# Patient Record
Sex: Female | Born: 1980 | Race: White | Hispanic: No | Marital: Married | Smoking: Former smoker
Health system: Southern US, Community
[De-identification: ages and names within clinical notes are randomized; demographics above are authoritative.]

## PROBLEM LIST (undated history)

## (undated) DIAGNOSIS — I776 Arteritis, unspecified: Secondary | ICD-10-CM

## (undated) HISTORY — DX: Arteritis, unspecified: I77.6

---

## 2004-12-13 ENCOUNTER — Emergency Department: Payer: Self-pay | Admitting: Emergency Medicine

## 2005-02-07 ENCOUNTER — Emergency Department: Payer: Self-pay | Admitting: Emergency Medicine

## 2005-04-27 ENCOUNTER — Emergency Department: Payer: Self-pay | Admitting: Emergency Medicine

## 2005-05-03 ENCOUNTER — Emergency Department: Payer: Self-pay | Admitting: Emergency Medicine

## 2005-05-04 ENCOUNTER — Ambulatory Visit: Payer: Self-pay | Admitting: Emergency Medicine

## 2005-07-30 ENCOUNTER — Emergency Department: Payer: Self-pay | Admitting: General Practice

## 2006-03-03 ENCOUNTER — Ambulatory Visit: Payer: Self-pay | Admitting: Unknown Physician Specialty

## 2006-03-04 ENCOUNTER — Inpatient Hospital Stay: Payer: Self-pay | Admitting: Obstetrics & Gynecology

## 2006-03-27 ENCOUNTER — Observation Stay: Payer: Self-pay

## 2006-04-04 ENCOUNTER — Observation Stay: Payer: Self-pay

## 2006-04-15 ENCOUNTER — Inpatient Hospital Stay: Payer: Self-pay | Admitting: Unknown Physician Specialty

## 2006-05-07 ENCOUNTER — Observation Stay: Payer: Self-pay | Admitting: Obstetrics and Gynecology

## 2006-05-07 ENCOUNTER — Inpatient Hospital Stay: Payer: Self-pay | Admitting: Obstetrics and Gynecology

## 2006-09-30 IMAGING — US US PELV - US TRANSVAGINAL
1 series · 17 of 25 positions shown · non-contrast
Comparison: none

REASON FOR EXAM: Pelvic Pain.
COMMENTS:

[Series 1: us pelv - us transvaginal · 17 of 84 slices shown]
[im 1/84]
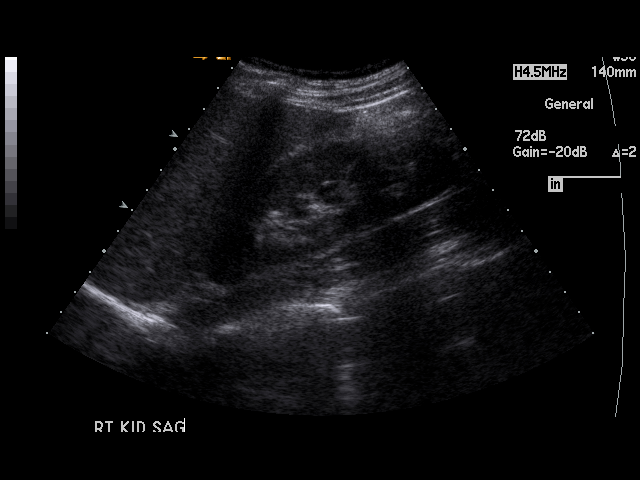
[im 7/84]
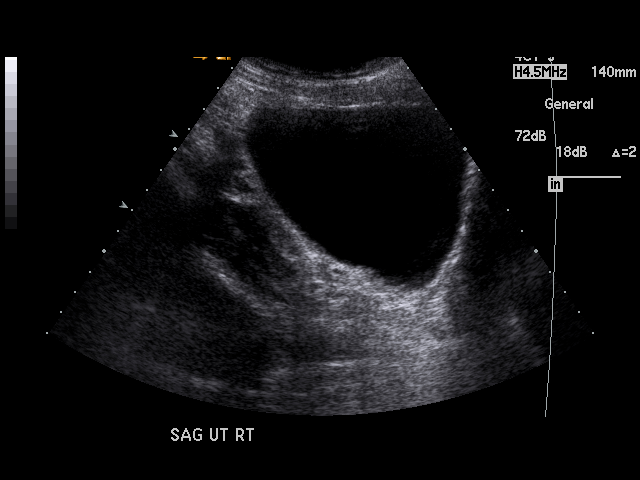
[im 11/84]
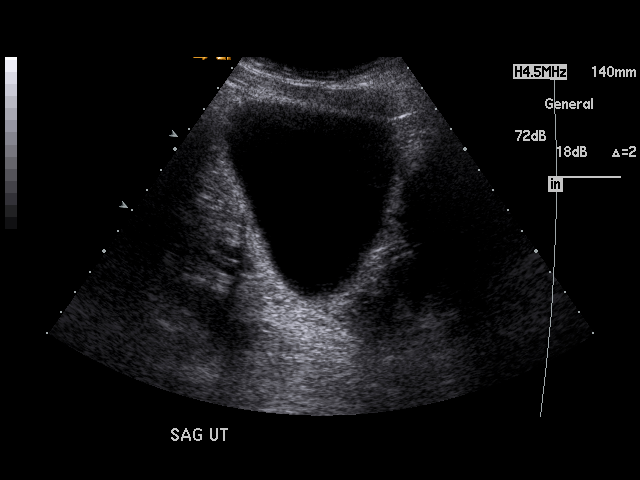
[im 18/84]
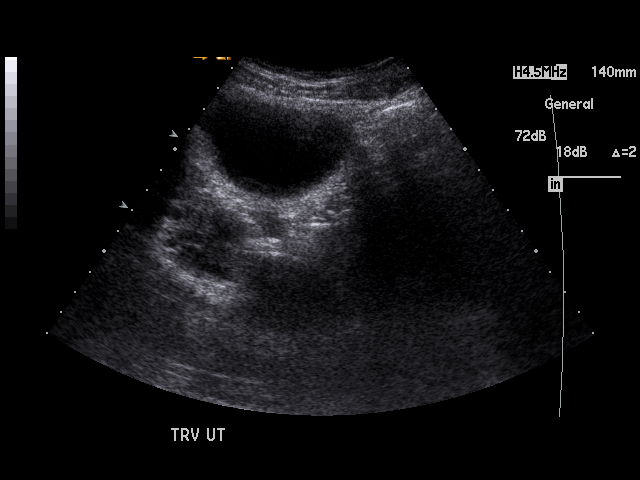
[im 21/84]
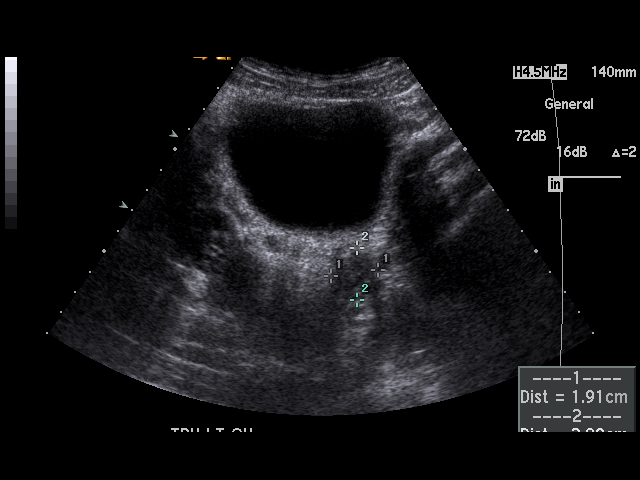
[im 28/84]
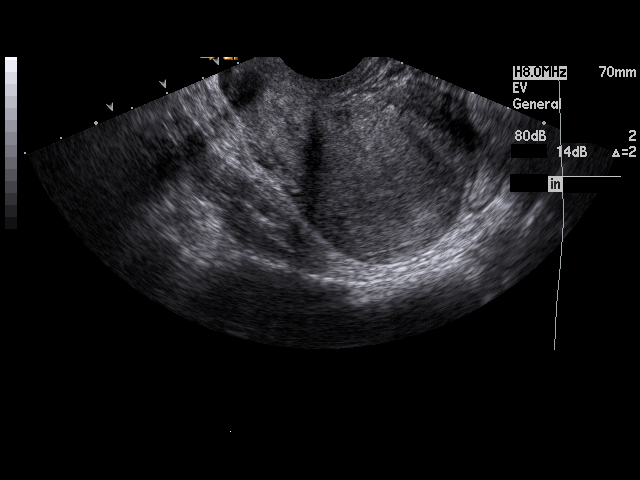
[im 32/84]
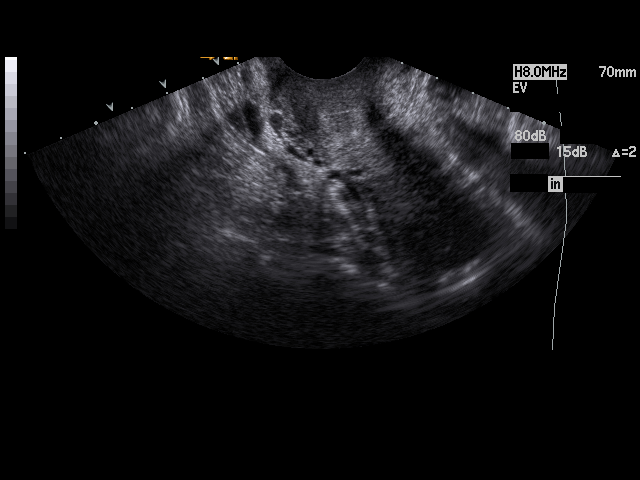
[im 39/84]
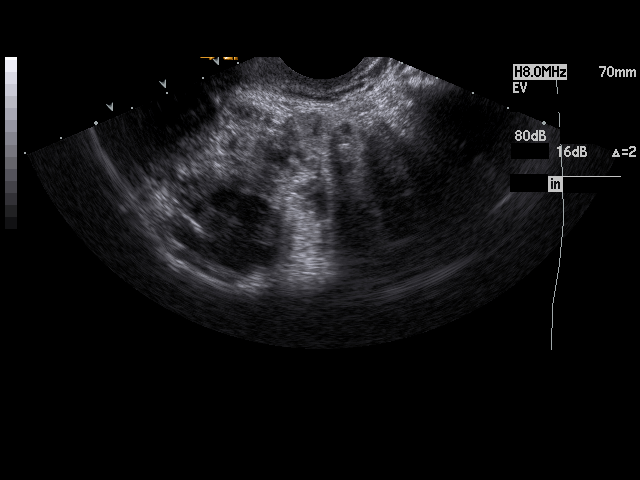
[im 42/84]
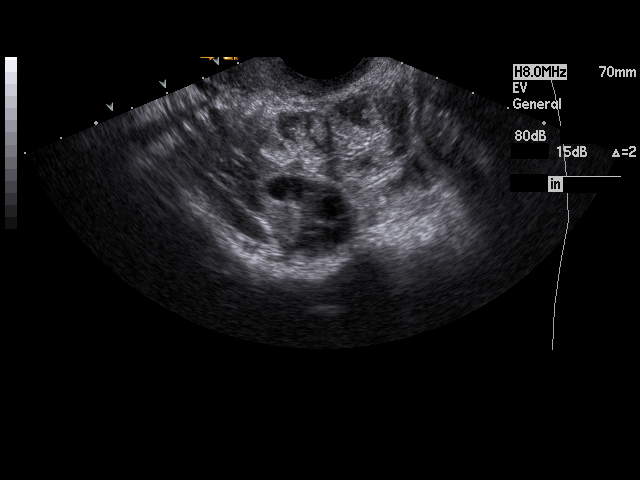
[im 45/84]
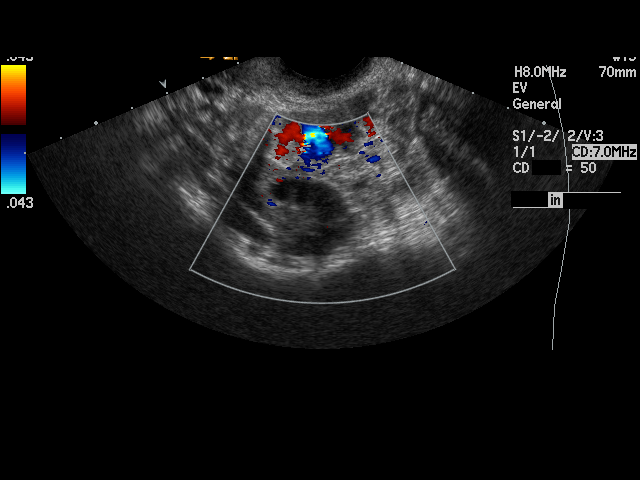
[im 52/84]
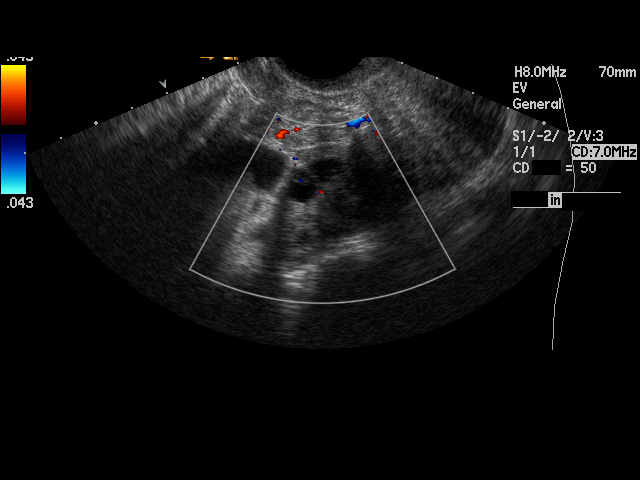
[im 56/84]
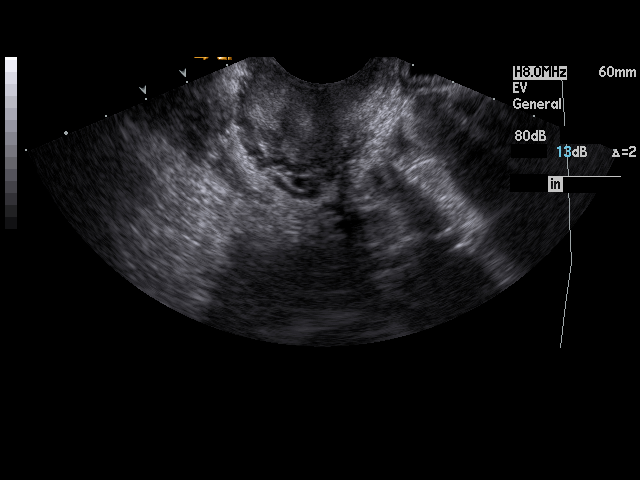
[im 63/84]
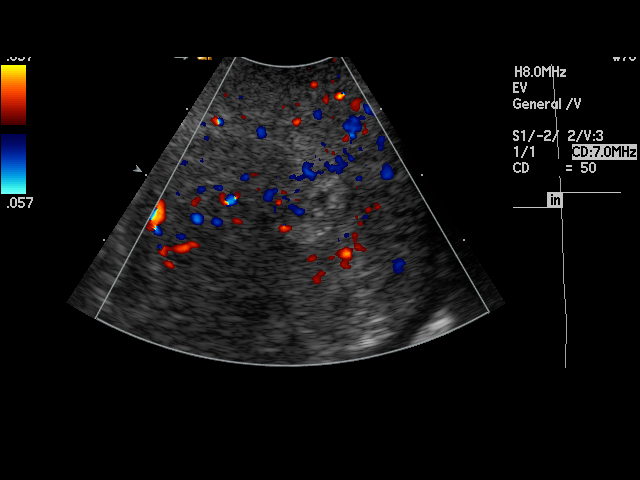
[im 66/84]
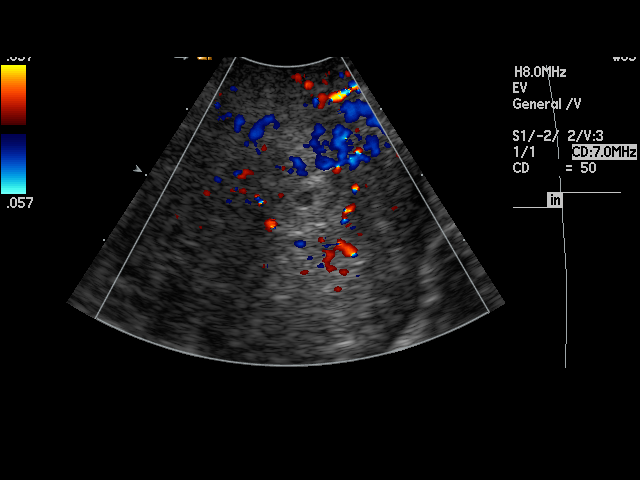
[im 73/84]
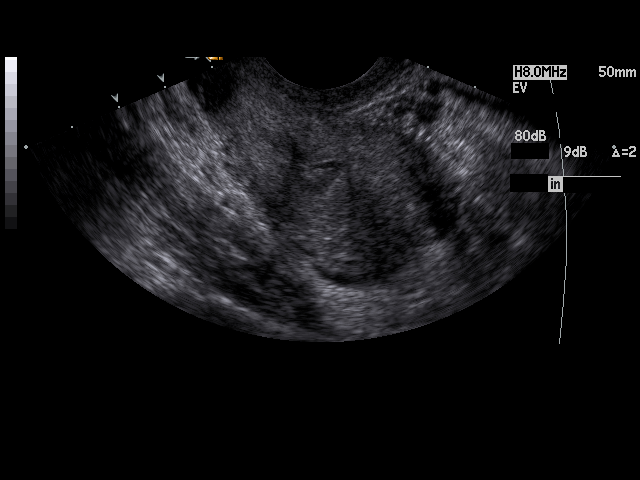
[im 77/84]
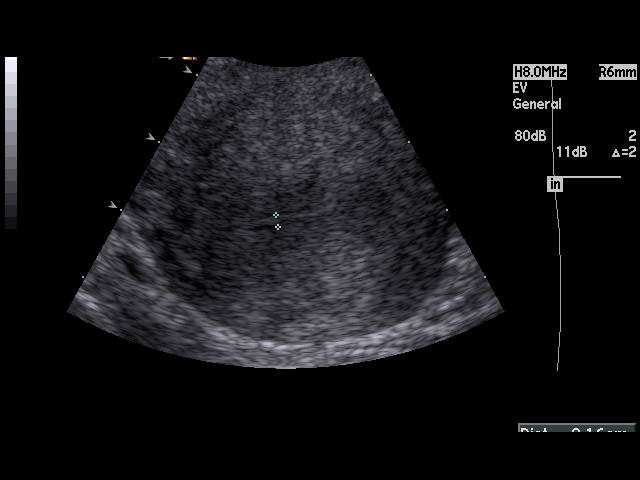
[im 84/84]
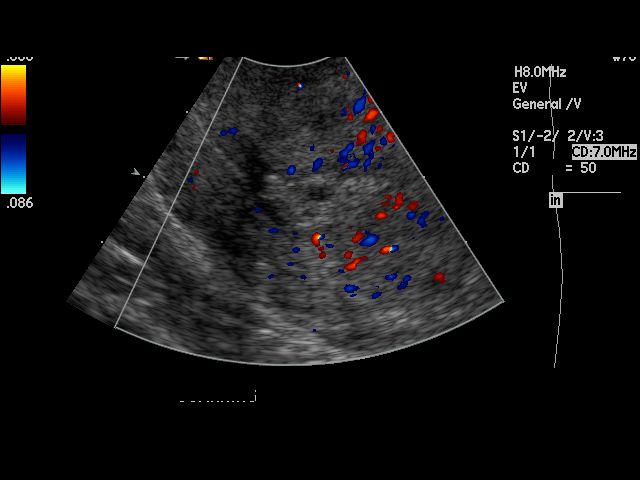

[17 of 25 positions shown; findings below may reference images not displayed]

PROCEDURE:     US  - US PELVIS MASS EXAM  - [DATE]  [DATE] [DATE]  [DATE]

RESULT:       The uterus measures 7.88 cm x 4.10 cm x 5.16 cm.  No uterine
mass lesion is seen.  The endometrium measures 6.8 mm at maximum thickness.
Within the endometrial canal just above the cervix there is an echo density
measuring approximately 1.12 cm diameter and having an area of central
lucency.  This may represent blood clot.  A follow-up exam would be helpful
to determine if this appearance persist.  Conceivably a polyp with
surrounding blood could produce a similar appearance.  The ovaries are
visualized bilaterally and are normal in appearance.  The RIGHT ovary
measures 3.35 cm at maximum diameter and the LEFT ovary measures 2.83 cm at
maximum diameter.  Follicular cysts are seen bilaterally.  No abnormal
adnexal masses are seen.  There is no free fluid in the pelvis.  The
visualized portion of the urinary bladder is normal in appearance.
IMPRESSION: There is echogenic material in the endometrial canal distally near the
cervix.  The etiology for this is to me uncertain.  Blood clot would be the
first consideration.  Incomplete abortion could produce a similar appearance
sonographically but is thought to be unlikely since the patient reportedly
has no history of recent pregnancy.  An endometrial polyp with associated
bleeding is also a consideration.  As noted above, a follow up exam would be
useful to determine if this appearance persist.

## 2007-06-30 ENCOUNTER — Emergency Department: Payer: Self-pay | Admitting: Internal Medicine

## 2007-09-12 IMAGING — US US RENAL KIDNEY
1 series · 17 of 25 positions shown · non-contrast
Comparison: none

REASON FOR EXAM: R flank pain and N/V and hematuria
COMMENTS:  LMP: Pregnant 36 weeks

[Series 1: us renal kidney · 17 of 28 slices shown]
[im 1/28]
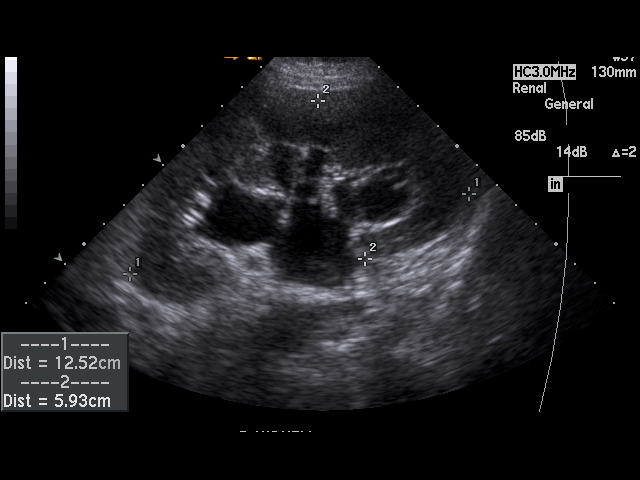
[im 3/28]
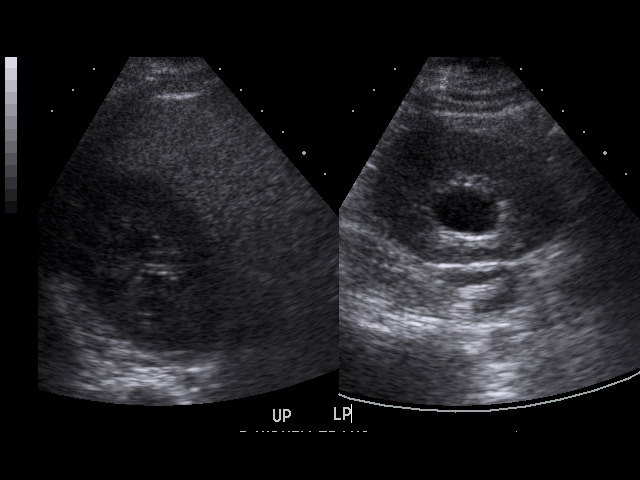
[im 4/28]
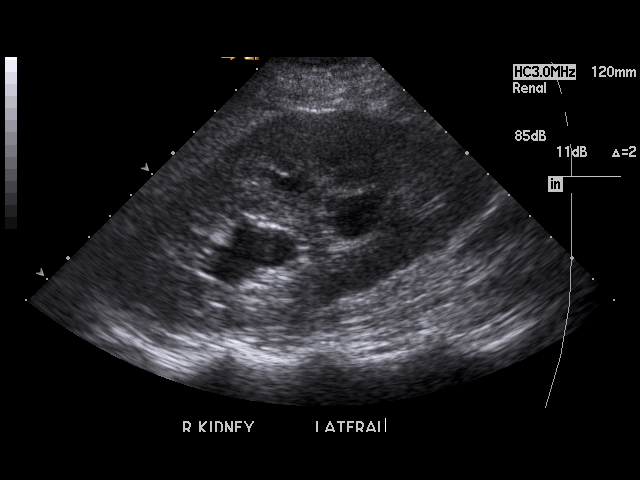
[im 6/28]
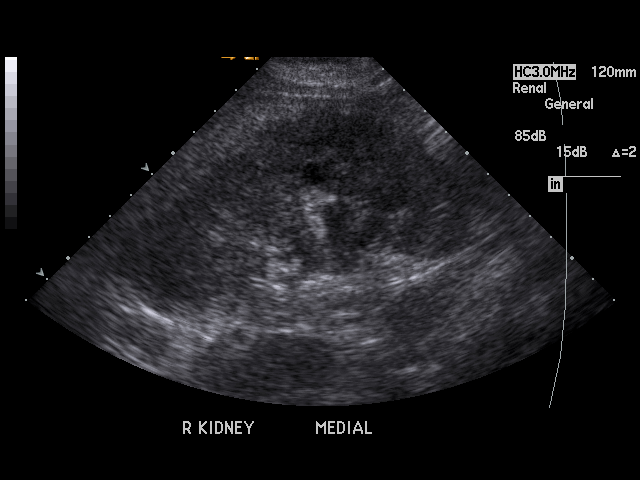
[im 7/28]
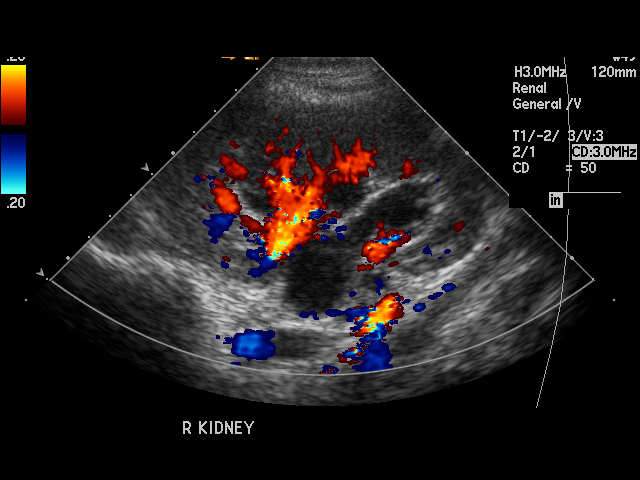
[im 10/28]
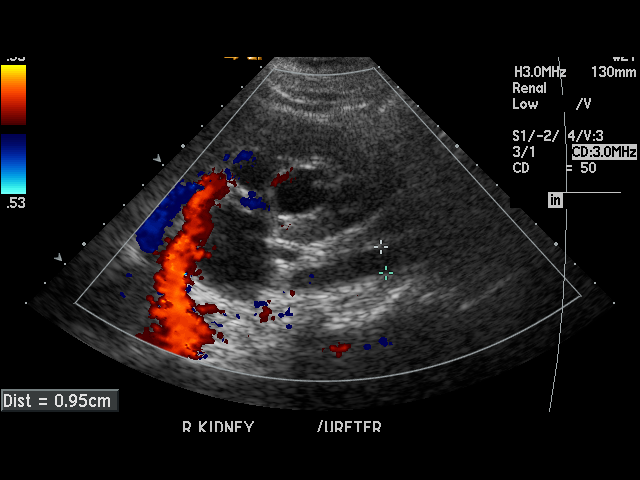
[im 11/28]
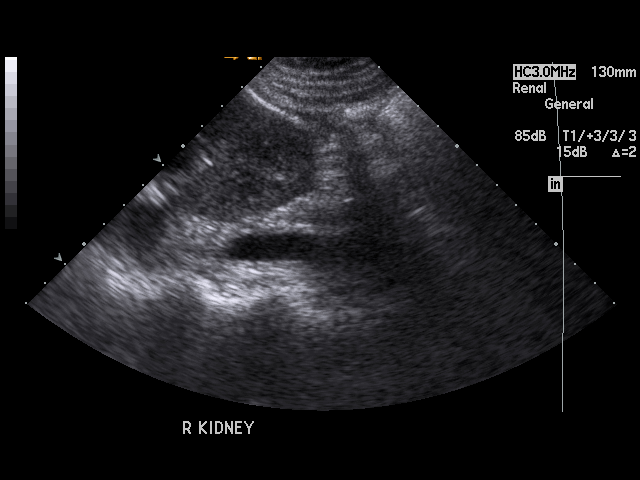
[im 13/28]
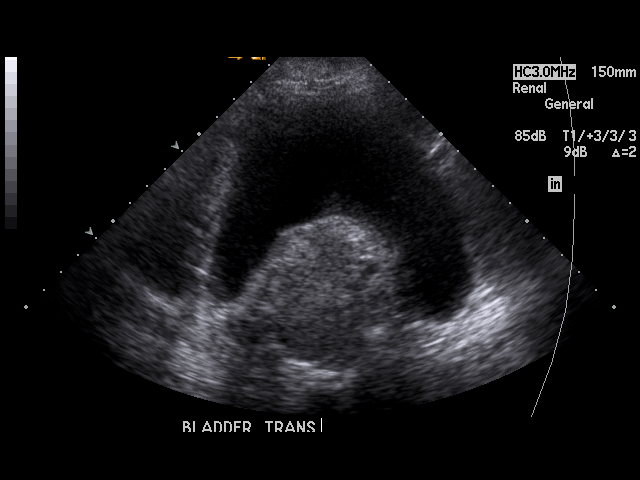
[im 14/28]
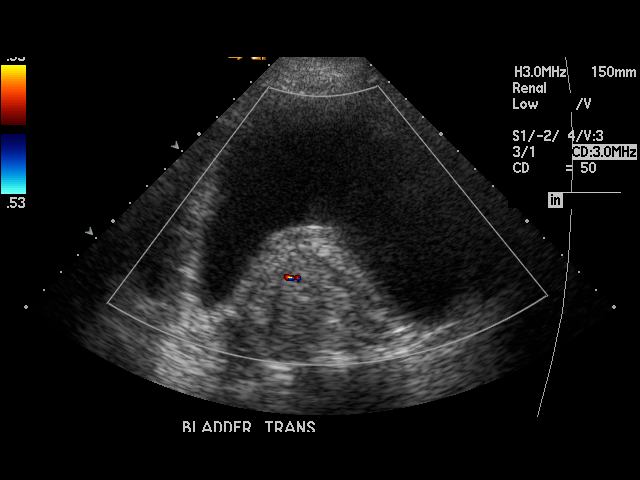
[im 15/28]
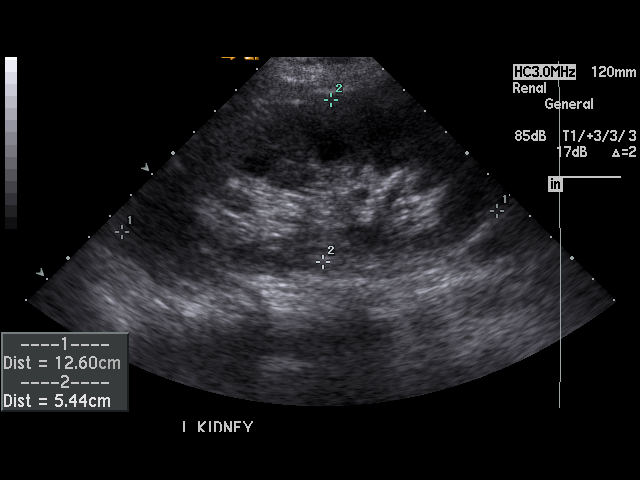
[im 17/28]
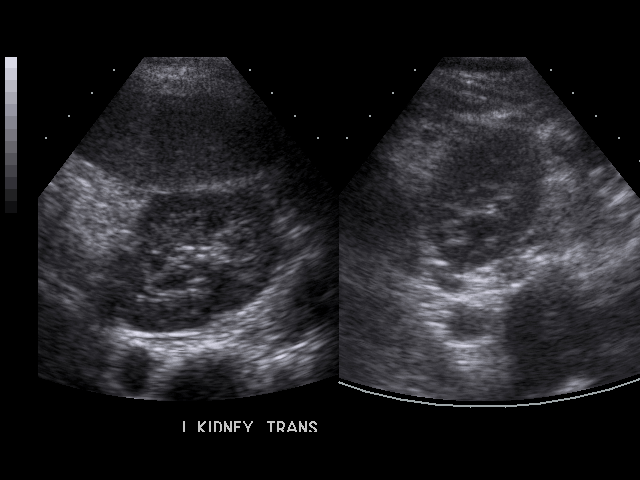
[im 19/28]
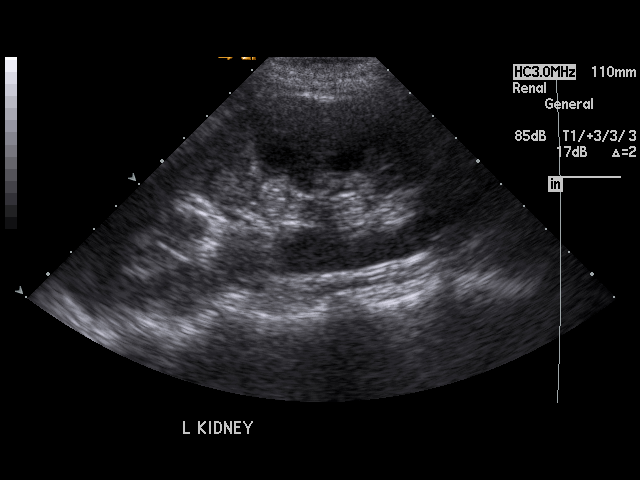
[im 21/28]
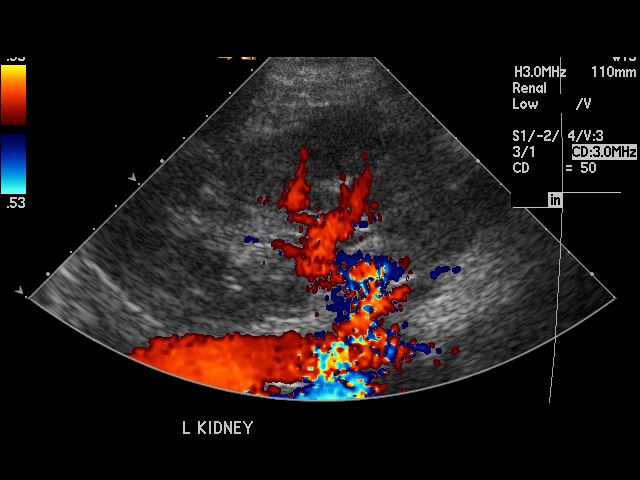
[im 22/28]
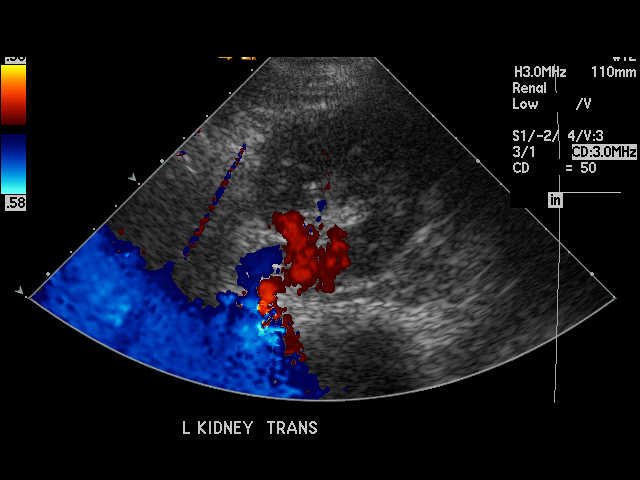
[im 24/28]
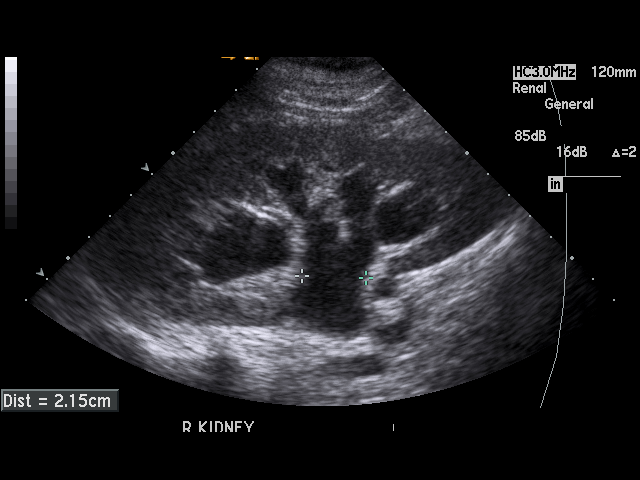
[im 25/28]
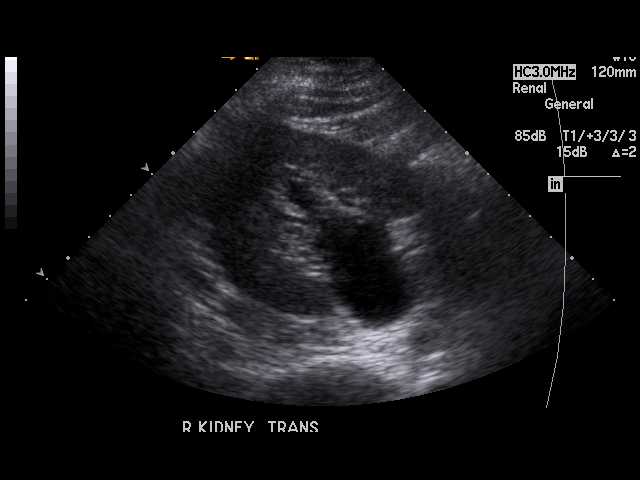
[im 28/28]
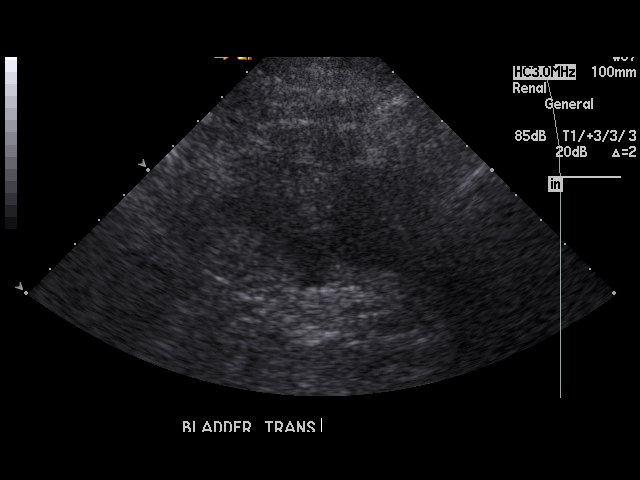

[17 of 25 positions shown; findings below may reference images not displayed]

PROCEDURE:     US  - US KIDNEY BILATERAL  - April 16, 2006  [DATE]

RESULT:     Size, shape, and echotexture of both kidneys are normal.  The
RIGHT kidney measures 12.5 cm and the LEFT 12.6.  Prominent hydronephrosis
of the RIGHT kidney is noted.  The patient is 36 weeks pregnant by history.
Mild LEFT hydronephrosis is noted.
IMPRESSION: Prominent hydronephrosis on the RIGHT, mild hydronephrosis on the LEFT.  The
patient is pregnant by history.

No focal renal parenchymal abnormalities are identified.

Bilateral renal flow is noted.

It should be noted that following voiding the hydronephrosis does not
resolve.

## 2014-08-07 ENCOUNTER — Emergency Department: Payer: Self-pay | Admitting: Emergency Medicine

## 2014-08-08 LAB — CBC WITH DIFFERENTIAL/PLATELET
BASOS ABS: 0 10*3/uL (ref 0.0–0.1)
Basophil %: 0.2 %
EOS ABS: 0.3 10*3/uL (ref 0.0–0.7)
Eosinophil %: 2.7 %
HCT: 39 % (ref 35.0–47.0)
HGB: 12.7 g/dL (ref 12.0–16.0)
LYMPHS PCT: 29.7 %
Lymphocyte #: 3.2 10*3/uL (ref 1.0–3.6)
MCH: 29.2 pg (ref 26.0–34.0)
MCHC: 32.5 g/dL (ref 32.0–36.0)
MCV: 90 fL (ref 80–100)
MONOS PCT: 6.6 %
Monocyte #: 0.7 x10 3/mm (ref 0.2–0.9)
NEUTROS PCT: 60.8 %
Neutrophil #: 6.5 10*3/uL (ref 1.4–6.5)
Platelet: 374 10*3/uL (ref 150–440)
RBC: 4.34 10*6/uL (ref 3.80–5.20)
RDW: 13.3 % (ref 11.5–14.5)
WBC: 10.7 10*3/uL (ref 3.6–11.0)

## 2014-08-08 LAB — URINALYSIS, COMPLETE
BILIRUBIN, UR: NEGATIVE
Blood: NEGATIVE
GLUCOSE, UR: NEGATIVE mg/dL (ref 0–75)
KETONE: NEGATIVE
LEUKOCYTE ESTERASE: NEGATIVE
Nitrite: NEGATIVE
PH: 6 (ref 4.5–8.0)
Protein: NEGATIVE
RBC,UR: NONE SEEN /HPF (ref 0–5)
Specific Gravity: 1.005 (ref 1.003–1.030)

## 2014-08-08 LAB — COMPREHENSIVE METABOLIC PANEL
ALK PHOS: 75 U/L
ALT: 59 U/L
Albumin: 4.1 g/dL (ref 3.4–5.0)
Anion Gap: 9 (ref 7–16)
BILIRUBIN TOTAL: 0.2 mg/dL (ref 0.2–1.0)
BUN: 13 mg/dL (ref 7–18)
Calcium, Total: 8.7 mg/dL (ref 8.5–10.1)
Chloride: 104 mmol/L (ref 98–107)
Co2: 26 mmol/L (ref 21–32)
Creatinine: 0.7 mg/dL (ref 0.60–1.30)
EGFR (African American): >60
EGFR (Non-African Amer.): >60
Glucose: 121 mg/dL — ABNORMAL HIGH (ref 65–99)
Osmolality: 279 (ref 275–301)
Potassium: 3.8 mmol/L (ref 3.5–5.1)
SGOT(AST): 35 U/L (ref 15–37)
SODIUM: 139 mmol/L (ref 136–145)
TOTAL PROTEIN: 8 g/dL (ref 6.4–8.2)

## 2014-08-08 LAB — LIPASE, BLOOD: Lipase: 233 U/L (ref 73–393)

## 2014-11-13 ENCOUNTER — Ambulatory Visit: Payer: Self-pay | Admitting: General Surgery

## 2014-12-26 ENCOUNTER — Encounter: Payer: Self-pay | Admitting: *Deleted

## 2015-07-10 ENCOUNTER — Ambulatory Visit: Payer: Self-pay | Admitting: Physician Assistant

## 2016-01-04 IMAGING — CT CT ABD-PELV W/ CM
2 of 4 series · 16 of 46 positions shown, 18 images · IV contrast (omnipaque)
Comparison: None.

CLINICAL DATA: Right lower quadrant fullness, pressure. Increased
over 3 months. History of stage I cervical cancer.

EXAM:
CT ABDOMEN AND PELVIS WITH CONTRAST
TECHNIQUE: Multidetector CT imaging of the abdomen and pelvis was performed
using the standard protocol following bolus administration of
intravenous contrast.
CONTRAST:  100 mL Omnipaque 350 IV.

[Series 2: routine abd pel with · axial · 0.72mm/px · z∈[-433,-18]mm · 13 of 91 slices shown, 15 images]
[im 4/91  soft-tissue]
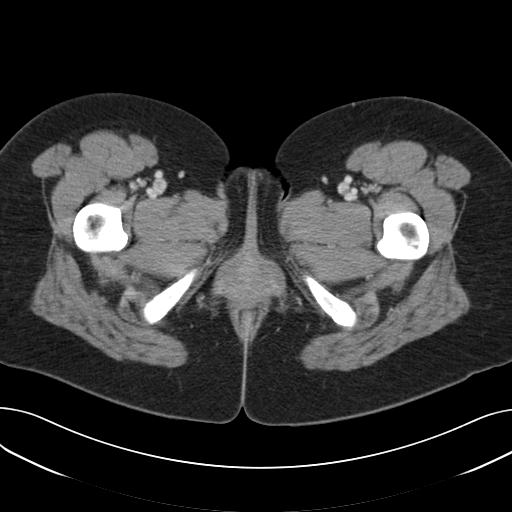
[im 4/91  bone]
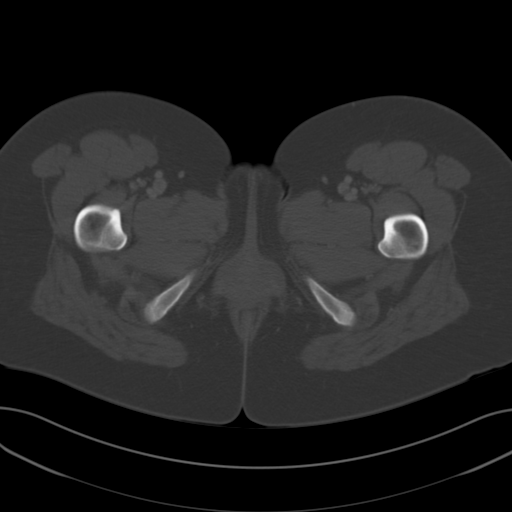
[im 12/91  soft-tissue]
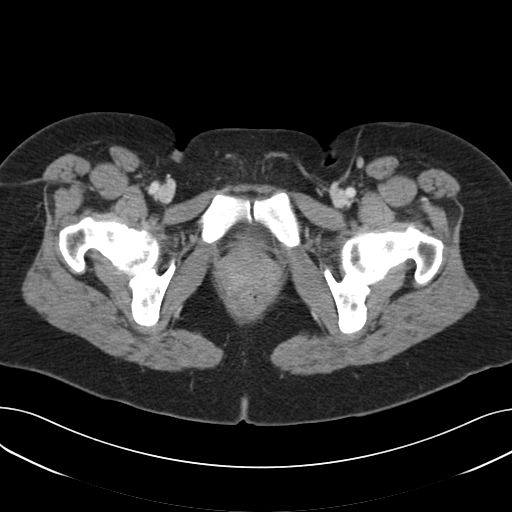
[im 19/91  soft-tissue]
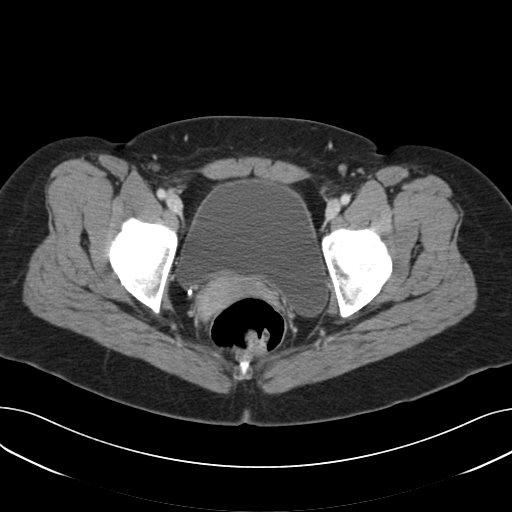
[im 27/91  soft-tissue]
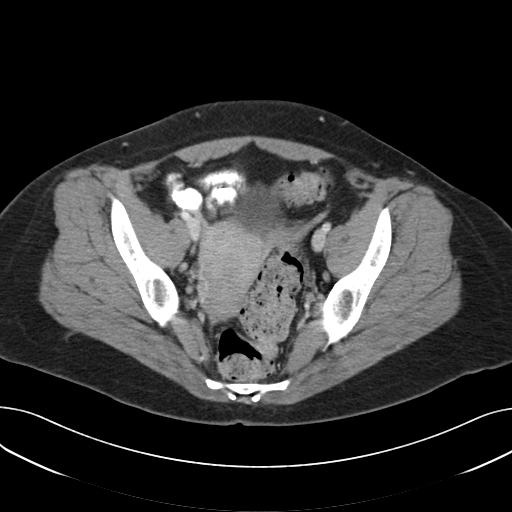
[im 31/91  soft-tissue]
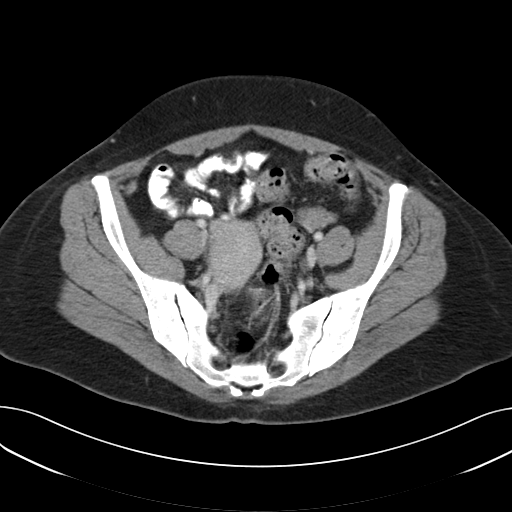
[im 38/91  soft-tissue]
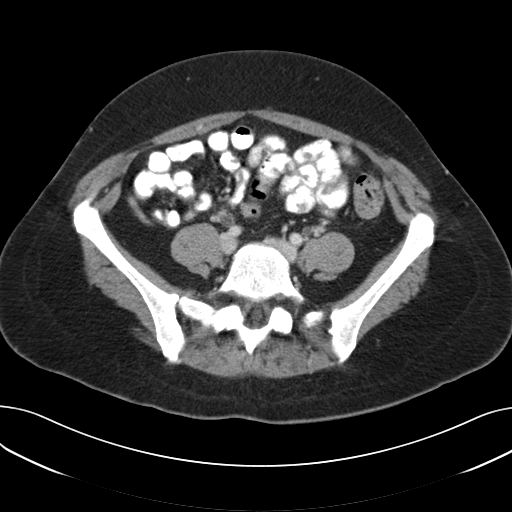
[im 46/91  soft-tissue]
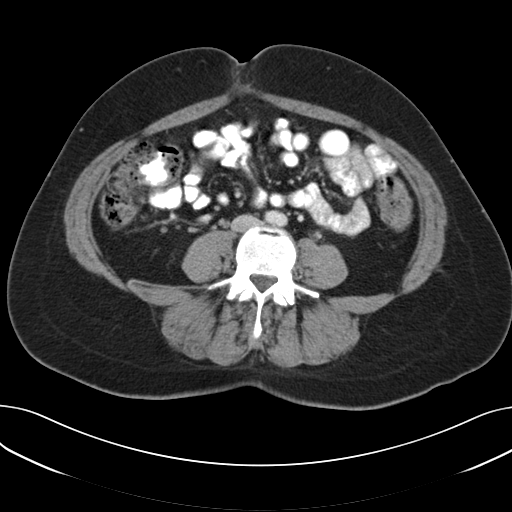
[im 53/91  soft-tissue]
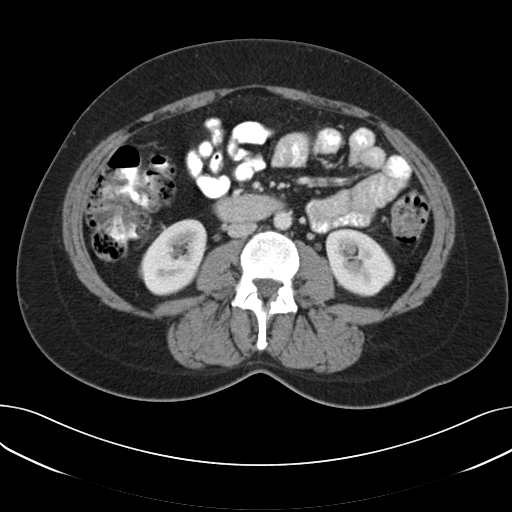
[im 61/91  soft-tissue]
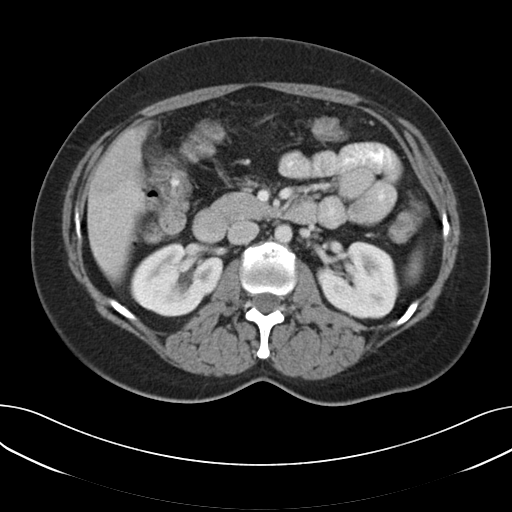
[im 61/91  bone]
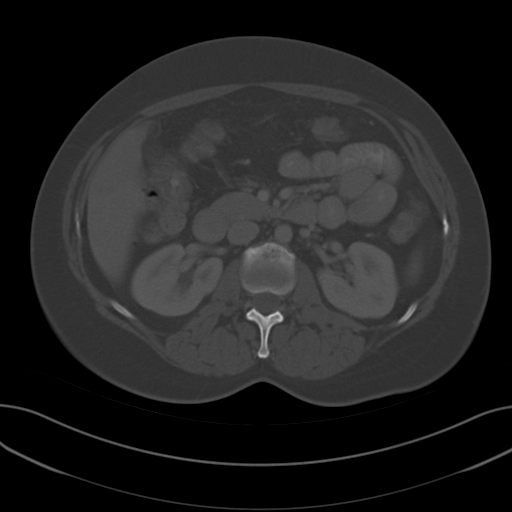
[im 64/91  soft-tissue]
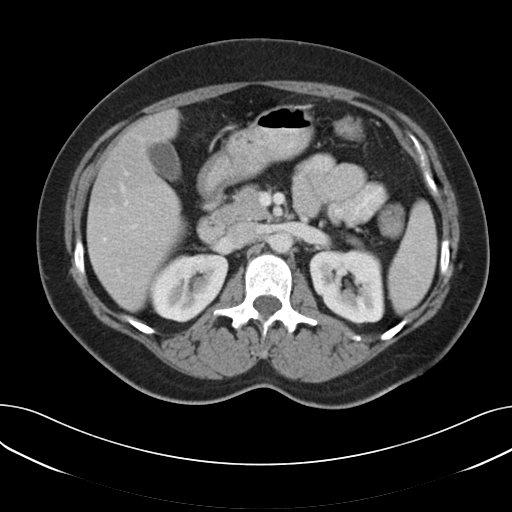
[im 72/91  soft-tissue]
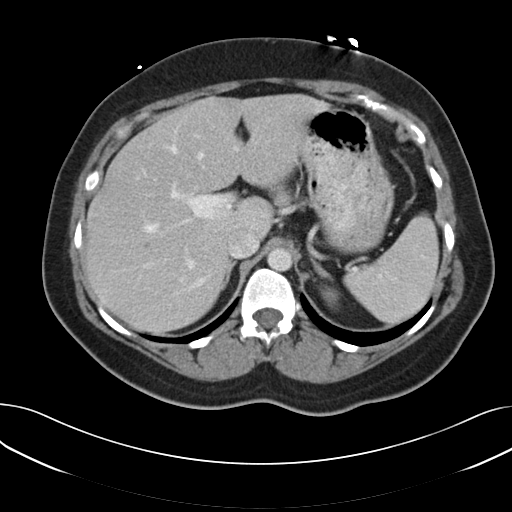
[im 79/91  soft-tissue]
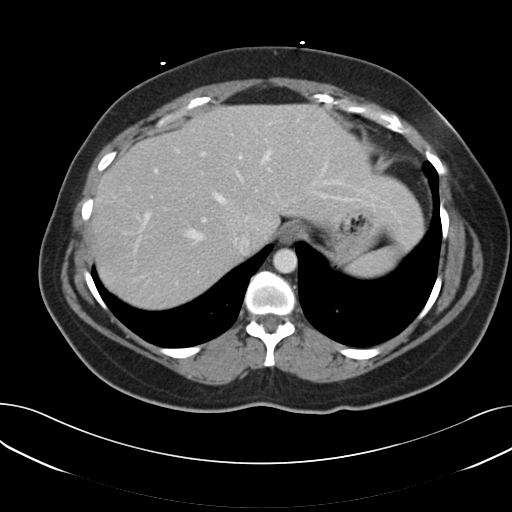
[im 87/91  soft-tissue]
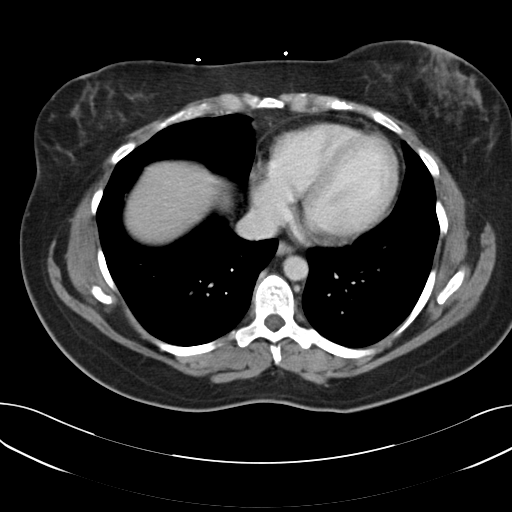

[Series 5: cor routine abd pel with · coronal · 0.91mm/px · 3 of 134 slices shown]
[im 45/134  soft-tissue]
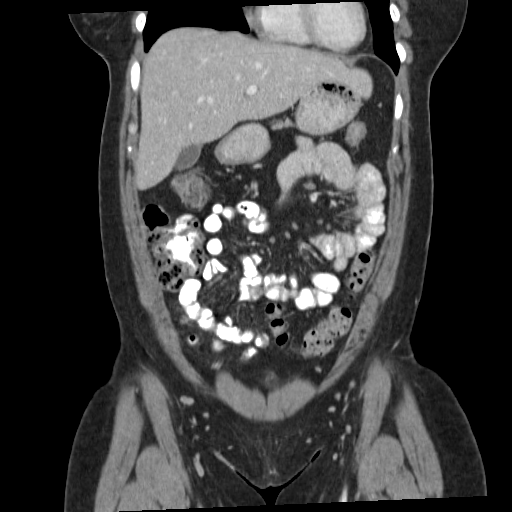
[im 60/134  soft-tissue]
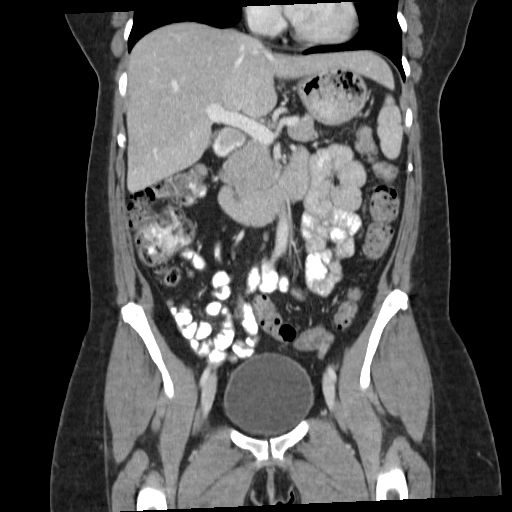
[im 74/134  soft-tissue]
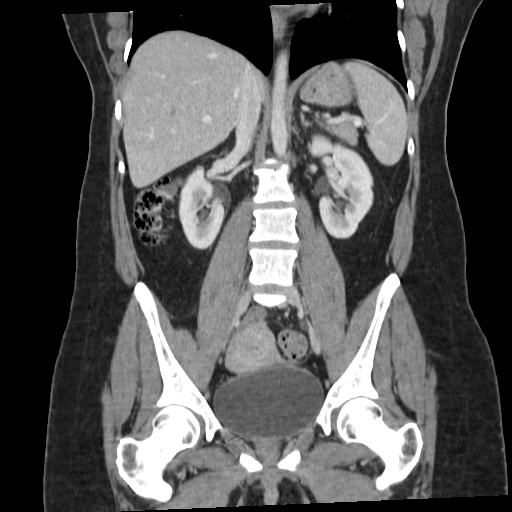

[16 of 46 positions shown; findings below may reference images not displayed]

FINDINGS: The included lung bases are clear.

The liver, gallbladder, spleen, pancreas, and adrenal glands are
normal. There is symmetric renal enhancement. There is mild
prominence of the right and left renal pelvis, likely extrarenal
pelvis. No obstructing stone. The bladder is physiologically
distended without bladder wall thickening.

There is probable mild colonic wall thickening from the hepatic
flexure to the descending colon, may reflect mild colitis. There are
no dilated or thickened small bowel loops. The appendix is normal.

The abdominal aorta is normal in caliber. There is no
retroperitoneal adenopathy. No free air, free fluid, or
intra-abdominal fluid collection.

The uterus appears prominent with minimal fluid in the endometrial
canal. Soft tissue fullness in the region of the cervix. Both
ovaries tentatively identified and normal. There is no pelvic free
fluid. Small lymph nodes in both external iliac chains. There is no
inguinal adenopathy.

There is a 9 mm sclerotic focus in the right superior pubic ramus,
nonspecific. Tiny sclerotic focus right acetabulum. No acute osseous
abnormality.
IMPRESSION: 1. Probable mild colonic wall thickening from the hepatic flexure to
the descending colon, may reflect mild colitis versus nondistention.
2. Soft tissue prominence in the region of the cervix.
3. Small sclerotic foci in the right superior pubic ramus and
acetabulum. These are likely bone islands, in the setting of
malignancy, metastatic disease is not entirely excluded.

## 2019-01-13 ENCOUNTER — Encounter: Payer: Self-pay | Admitting: Advanced Practice Midwife

## 2019-01-13 ENCOUNTER — Ambulatory Visit: Payer: No Typology Code available for payment source | Admitting: Advanced Practice Midwife

## 2019-05-29 ENCOUNTER — Other Ambulatory Visit: Payer: Self-pay

## 2019-05-29 DIAGNOSIS — Z20822 Contact with and (suspected) exposure to covid-19: Secondary | ICD-10-CM

## 2019-05-30 LAB — NOVEL CORONAVIRUS, NAA: SARS-CoV-2, NAA: NOT DETECTED

## 2019-06-30 ENCOUNTER — Other Ambulatory Visit: Payer: Self-pay

## 2019-06-30 DIAGNOSIS — Z20822 Contact with and (suspected) exposure to covid-19: Secondary | ICD-10-CM

## 2019-07-03 LAB — NOVEL CORONAVIRUS, NAA: SARS-CoV-2, NAA: NOT DETECTED

## 2019-07-06 ENCOUNTER — Other Ambulatory Visit: Payer: Self-pay

## 2019-07-06 DIAGNOSIS — Z20822 Contact with and (suspected) exposure to covid-19: Secondary | ICD-10-CM

## 2019-07-08 LAB — NOVEL CORONAVIRUS, NAA: SARS-CoV-2, NAA: NOT DETECTED

## 2019-08-15 ENCOUNTER — Ambulatory Visit: Payer: No Typology Code available for payment source | Attending: Internal Medicine

## 2019-08-15 DIAGNOSIS — Z20822 Contact with and (suspected) exposure to covid-19: Secondary | ICD-10-CM | POA: Insufficient documentation

## 2019-08-16 LAB — NOVEL CORONAVIRUS, NAA: SARS-CoV-2, NAA: NOT DETECTED

## 2020-08-26 ENCOUNTER — Ambulatory Visit
Admission: EM | Admit: 2020-08-26 | Discharge: 2020-08-26 | Disposition: A | Discharge status: Home / Self Care | Payer: No Typology Code available for payment source

## 2020-08-26 ENCOUNTER — Encounter: Payer: Self-pay | Admitting: Emergency Medicine

## 2020-08-26 ENCOUNTER — Ambulatory Visit: Payer: Self-pay

## 2020-08-26 ENCOUNTER — Other Ambulatory Visit: Payer: Self-pay

## 2020-08-26 DIAGNOSIS — R1031 Right lower quadrant pain: Secondary | ICD-10-CM | POA: Insufficient documentation

## 2020-08-26 DIAGNOSIS — R197 Diarrhea, unspecified: Secondary | ICD-10-CM | POA: Insufficient documentation

## 2020-08-26 DIAGNOSIS — G8929 Other chronic pain: Secondary | ICD-10-CM | POA: Insufficient documentation

## 2020-08-26 DIAGNOSIS — R112 Nausea with vomiting, unspecified: Secondary | ICD-10-CM | POA: Insufficient documentation

## 2020-08-26 DIAGNOSIS — K5289 Other specified noninfective gastroenteritis and colitis: Secondary | ICD-10-CM | POA: Insufficient documentation

## 2020-08-26 LAB — URINALYSIS, COMPLETE (UACMP) WITH MICROSCOPIC
Bilirubin Urine: NEGATIVE
Glucose, UA: NEGATIVE mg/dL
Hgb urine dipstick: NEGATIVE
Ketones, ur: NEGATIVE mg/dL
Leukocytes,Ua: NEGATIVE
Nitrite: NEGATIVE
Protein, ur: NEGATIVE mg/dL
RBC / HPF: NONE SEEN RBC/hpf (ref 0–5)
Specific Gravity, Urine: 1.025 (ref 1.005–1.030)
pH: 5.5 (ref 5.0–8.0)

## 2020-08-26 MED ORDER — ONDANSETRON 4 MG PO TBDP: 4.0000 mg | ORAL_TABLET | Freq: Three times a day (TID) | ORAL | 0 refills | Status: DC | PRN

## 2020-08-26 NOTE — Discharge Instructions (Addendum)
Only take Pepto as directed per box Follow BRAT diet  Avoid any dairy

## 2020-08-26 NOTE — ED Provider Notes (Signed)
[facility]    CSN: [id number] Arrival date & time: 08/26/20  1443      History   Chief Complaint Chief Complaint  Patient presents with  . Abdominal Pain  . Vomiting  . Diarrhea         HPI Andrea Mcgee is a 40 y.o. female who presents with being covid + as of 1/14 and has been having vomiting and having diarrhea x 3 days at least 12 per day with small amt of stool at a time and neg blood. No hx of antibiotic use.  Vomits 2-3 times per day  2- Chronic abdominal pain since 2016 at which time had an abdominal CT and was neg. Was told she may have a hernia and was supposed to Fu with a Careers adviser but she never went. In the past week the pain has been worse, she feels a fullness on the R lower and mid abdomen and felt pain from her R groin to R mid abd.She does not have PCP or insurance.  History reviewed. No pertinent past medical history.  There are no problems to display for this patient.   History reviewed. No pertinent surgical history.  OB History    Gravida  1   Para  1   Term  1   Preterm      AB      Living        SAB      IAB      Ectopic      Multiple      Live Births           Obstetric Comments            Home Medications    Prior to Admission medications   Medication Sig Start Date End Date Taking? Authorizing Provider  levocetirizine (XYZAL) 5 MG tablet Take 5 mg by mouth every evening.   Yes [provider]  ondansetron (ZOFRAN ODT) 4 MG disintegrating tablet Take 1 tablet (4 mg total) by mouth every 8 (eight) hours as needed for nausea or vomiting. 08/26/20  Yes Rodriguez-Southworth, Nettie Elm, PA-C    Family History Family History  Problem Relation Age of Onset  . Diabetes Maternal Grandmother   . Heart attack Maternal Grandmother   . Breast cancer Paternal Grandmother 22  . Lung cancer Paternal Uncle     Social History Social History   Tobacco Use  . Smoking status: Never Smoker  . Smokeless  tobacco: Never Used  Vaping Use  . Vaping Use: Never used  Substance Use Topics  . Alcohol use: Yes  . Drug use: Never     Allergies   Patient has no known allergies.   Review of Systems Review of Systems  Constitutional: Positive for appetite change, chills and fatigue. Negative for diaphoresis and fever.  HENT: Negative for congestion, ear discharge, ear pain, postnasal drip and rhinorrhea.   Eyes: Negative for discharge.  Respiratory: Negative for cough.   Gastrointestinal: Positive for abdominal pain, blood in stool, diarrhea and nausea. Negative for abdominal distention.  Genitourinary: Negative for difficulty urinating, dysuria, frequency, hematuria, pelvic pain, vaginal bleeding and vaginal discharge.  Musculoskeletal: Positive for back pain. Negative for gait problem and myalgias.       Aches between shoulderblades  Skin: Negative for rash and wound.  Neurological: Positive for weakness and headaches. Negative for dizziness.  Hematological: Negative for adenopathy.   Physical Exam Triage Vital Signs ED Triage Vitals  Enc Vitals Group  BP 08/26/20 1506 129/89     Pulse Rate 08/26/20 1506 85     Resp 08/26/20 1506 18     Temp 08/26/20 1506 98.5 F (36.9 C)     Temp Source 08/26/20 1506 Oral     SpO2 08/26/20 1506 99 %     Weight 08/26/20 1503 185 lb (83.9 kg)     Height 08/26/20 1503 5\' 4"  (1.626 m)     Head Circumference --      Peak Flow --      Pain Score 08/26/20 1503 6     Pain Loc --      Pain Edu? --      Excl. in GC? --    No data found.  Updated Vital Signs BP 129/89 (BP Location: Left Arm)   Pulse 85   Temp 98.5 F (36.9 C) (Oral)   Resp 18   Ht 5\' 4"  (1.626 m)   Wt 185 lb (83.9 kg)   LMP 08/13/2020   SpO2 99%   BMI 31.76 kg/m   Visual Acuity Right Eye Distance:   Left Eye Distance:   Bilateral Distance:    Right Eye Near:   Left Eye Near:    Bilateral Near:     Physical Exam Vitals and nursing note reviewed.  Constitutional:       General: She is not in acute distress.    Appearance: She is obese. She is not toxic-appearing.  HENT:     Head: Normocephalic.  Eyes:     Extraocular Movements: Extraocular movements intact.  Cardiovascular:     Rate and Rhythm: Normal rate and regular rhythm.  Pulmonary:     Effort: Pulmonary effort is normal.     Breath sounds: Normal breath sounds.  Abdominal:     General: Abdomen is protuberant. Bowel sounds are normal.     Palpations: Abdomen is soft. There is no hepatomegaly or splenomegaly.     Tenderness: There is abdominal tenderness in the right lower quadrant. There is no guarding or rebound. Negative signs include McBurney's sign and psoas sign.     Hernia: No hernia is present.     Comments: I also examined her standing and the R groin is a little puffy when compared to the L, and R mid abdomen, right below umbilicus is firm and tender. But soft when laying on her back. I dont see any hernias  Skin:    General: Skin is warm and dry.     Findings: No erythema or rash.  Neurological:     General: No focal deficit present.     Mental Status: She is alert and oriented to person, place, and time.  Psychiatric:        Mood and Affect: Mood normal.        Behavior: Behavior normal.    UC Treatments / Results  Labs (all labs ordered are listed, but only abnormal results are displayed) Labs Reviewed  URINALYSIS, COMPLETE (UACMP) WITH MICROSCOPIC - Abnormal; Notable for the following components:      Result Value   APPearance HAZY (*)    Bacteria, UA MANY (*)    All other components within normal limits    EKG   Radiology No results found.  Procedures Procedures (including critical care time)  Medications Ordered in UC Medications - No data to display  Initial Impression / Assessment and Plan / UC Course  I have reviewed the triage vital signs and the nursing notes. She will call   to get an abdominal ultrasound and will go to [facility] to get CBC  with diff and CMP since they are free for employees. We will inform her when the tests are back, but she is to check with [place] if she does not hear from [place] after 24 hours of having them done.  May just have  viral GE and recommended to follow BRAT diet and pepto. Needs to get established with a PCP.  I sent Zofran for her nausea.   Final Clinical Impressions(s) / UC Diagnoses   Final diagnoses:  Other noninfectious gastroenteritis  Nausea vomiting and diarrhea  Abdominal pain, chronic, right lower quadrant  Diarrhea, unspecified type     Discharge Instructions     Only take Pepto as directed per box Follow BRAT diet  Avoid any dairy    ED Prescriptions    Medication Sig Dispense Auth. Provider   ondansetron (ZOFRAN ODT) 4 MG disintegrating tablet Take 1 tablet (4 mg total) by mouth every 8 (eight) hours as needed for nausea or vomiting. 20 tablet Rodriguez-Southworth, Nettie Elm, PA-C     PDMP not reviewed this encounter.   Garey Ham, [place] 08/26/20 1855

## 2020-08-26 NOTE — ED Triage Notes (Signed)
Patient c/o abdominal pain that started 2 years ago. She states a doctor told her that she probably had a hernia and was supposed to f/u with a surgeon but she never did. She states over the last week the pain has worsened. Patient reports vomiting and diarrhea that started 3 days ago.  She does report being COVID positive on 08/09/20.

## 2020-08-28 ENCOUNTER — Other Ambulatory Visit: Payer: Self-pay | Admitting: Internal Medicine

## 2020-08-28 ENCOUNTER — Other Ambulatory Visit: Payer: Self-pay

## 2020-08-28 ENCOUNTER — Ambulatory Visit
Admission: RE | Admit: 2020-08-28 | Discharge: 2020-08-28 | Disposition: A | Discharge status: Home / Self Care | Payer: Self-pay | Source: Ambulatory Visit | Attending: Internal Medicine | Admitting: Internal Medicine

## 2020-08-28 DIAGNOSIS — R1031 Right lower quadrant pain: Secondary | ICD-10-CM

## 2020-08-28 LAB — CBC WITH DIFFERENTIAL/PLATELET
Basophils Absolute: 0 10*3/uL (ref 0.0–0.2)
Basos: 1 %
EOS (ABSOLUTE): 0.1 10*3/uL (ref 0.0–0.4)
Eos: 2 %
Hematocrit: 40.5 % (ref 34.0–46.6)
Hemoglobin: 13 g/dL (ref 11.1–15.9)
Immature Grans (Abs): 0 10*3/uL (ref 0.0–0.1)
Immature Granulocytes: 0 %
Lymphocytes Absolute: 1.7 10*3/uL (ref 0.7–3.1)
Lymphs: 24 %
MCH: 27.8 pg (ref 26.6–33.0)
MCHC: 32.1 g/dL (ref 31.5–35.7)
MCV: 87 fL (ref 79–97)
Monocytes Absolute: 0.5 10*3/uL (ref 0.1–0.9)
Monocytes: 7 %
Neutrophils Absolute: 4.4 10*3/uL (ref 1.4–7.0)
Neutrophils: 66 %
Platelets: 346 10*3/uL (ref 150–450)
RBC: 4.67 x10E6/uL (ref 3.77–5.28)
RDW: 13.1 % (ref 11.7–15.4)
WBC: 6.8 10*3/uL (ref 3.4–10.8)

## 2020-08-28 LAB — COMPREHENSIVE METABOLIC PANEL
ALT: 167 IU/L — ABNORMAL HIGH (ref 0–32)
AST: 122 IU/L — ABNORMAL HIGH (ref 0–40)
Albumin/Globulin Ratio: 1.8 (ref 1.2–2.2)
Albumin: 4.4 g/dL (ref 3.8–4.8)
Alkaline Phosphatase: 80 IU/L (ref 44–121)
BUN/Creatinine Ratio: 12 (ref 9–23)
BUN: 8 mg/dL (ref 6–20)
Bilirubin Total: 0.3 mg/dL (ref 0.0–1.2)
CO2: 21 mmol/L (ref 20–29)
Calcium: 9.2 mg/dL (ref 8.7–10.2)
Chloride: 105 mmol/L (ref 96–106)
Creatinine, Ser: 0.67 mg/dL (ref 0.57–1.00)
GFR calc Af Amer: 128 mL/min/{1.73_m2} (ref >59)
GFR calc non Af Amer: 111 mL/min/{1.73_m2} (ref >59)
Globulin, Total: 2.4 g/dL (ref 1.5–4.5)
Glucose: 121 mg/dL — ABNORMAL HIGH (ref 65–99)
Potassium: 4 mmol/L (ref 3.5–5.2)
Sodium: 140 mmol/L (ref 134–144)
Total Protein: 6.8 g/dL (ref 6.0–8.5)

## 2020-08-29 ENCOUNTER — Ambulatory Visit: Payer: Self-pay

## 2020-11-06 ENCOUNTER — Encounter: Payer: Self-pay | Admitting: Internal Medicine

## 2020-11-06 ENCOUNTER — Ambulatory Visit (INDEPENDENT_AMBULATORY_CARE_PROVIDER_SITE_OTHER): Payer: Self-pay | Admitting: Internal Medicine

## 2020-11-06 ENCOUNTER — Other Ambulatory Visit: Payer: Self-pay

## 2020-11-06 VITALS — BP 118/70 | HR 80 | Ht 64.0 in | Wt 188.0 lb

## 2020-11-06 DIAGNOSIS — R739 Hyperglycemia, unspecified: Secondary | ICD-10-CM

## 2020-11-06 DIAGNOSIS — F321 Major depressive disorder, single episode, moderate: Secondary | ICD-10-CM | POA: Insufficient documentation

## 2020-11-06 DIAGNOSIS — L501 Idiopathic urticaria: Secondary | ICD-10-CM | POA: Insufficient documentation

## 2020-11-06 DIAGNOSIS — R7989 Other specified abnormal findings of blood chemistry: Secondary | ICD-10-CM

## 2020-11-06 DIAGNOSIS — R8271 Bacteriuria: Secondary | ICD-10-CM

## 2020-11-06 MED ORDER — NITROFURANTOIN MONOHYD MACRO 100 MG PO CAPS: 100.0000 mg | ORAL_CAPSULE | Freq: Two times a day (BID) | ORAL | 0 refills | Status: AC

## 2020-11-06 MED ORDER — ESCITALOPRAM OXALATE 10 MG PO TABS: 10.0000 mg | ORAL_TABLET | Freq: Every day | ORAL | 1 refills | Status: DC

## 2020-11-06 NOTE — Progress Notes (Signed)
Date:  11/06/2020   Name:  Andrea Mcgee   DOB:  02/15/1981   MRN:  [id number]   Chief Complaint: Establish Care (Last labs at [facility] for liver and kidney function were elevated and she wants that rechecked today as well. She has a lot of pain in the last 2 months on the right- upper abdomen. Sometimes pains shoots to back. ), Blood Sugar Problem (1 month ago- Was at work and friend at work said she felt "things were blacking out." Has been thirsty a lot more often than normal. Fatigued. Wants A1C checked to check for diabetes since this does run in her family. Blood Sugar was 177 with only a cup of coffee. ), and Palpitations (Patient said at times since she tested POS for covid at the end of January 2 months ago. Said at times she can feel her hear speed up and then a pain shoots across to her left arm. Not having this pain today.)  Depression        This is a chronic problem.  Associated symptoms include fatigue and insomnia.  Associated symptoms include no decreased interest, no appetite change, no headaches and no suicidal ideas.     The symptoms are aggravated by family issues (husband works in [place] and she rarely sees him; 34 y/o son at home).  Past treatments include nothing. Elevated Liver function tests - seen at UC last month.  [place] below did not include liver.  She was 3 weeks post covid with nausea and diarrhea.  She was well hydrated and was only taking Advil as needed, no tylenol. Stools are looser than normal.  Still has left side discomfort.  No blood in the urine but UA at UC had bacteria and WBC.  Elevated BS - random reading at home 170.  She has been thirsty and tired.  She has polyuria and polydipsia.  US PELVIS LIMITED (TRANSABDOMINAL ONLY)  Result Date: 08/28/2020 CLINICAL DATA:  Right lower quadrant and groin pain. Evaluate for hernia. EXAM: ULTRASOUND OF RIGHT GROIN SOFT TISSUES TECHNIQUE: Ultrasound examination of the groin soft tissues was performed in the area of clinical  concern. COMPARISON:  None. FINDINGS: Focused ultrasound of the right groin demonstrates no inguinal hernia, mass, or fluid collection. IMPRESSION: 1. Negative. No sonographic abnormality seen in the right groin. Electronically Signed   By: Obie Dredge M.D.   On: 08/28/2020 14:31   Lab Results  Component Value Date   CREATININE 0.67 08/27/2020   BUN 8 08/27/2020   NA 140 08/27/2020   K 4.0 08/27/2020   CL 105 08/27/2020   CO2 21 08/27/2020   No results found for: CHOL, HDL, LDLCALC, LDLDIRECT, TRIG, CHOLHDL No results found for: TSH No results found for: HGBA1C Lab Results  Component Value Date   WBC 6.8 08/27/2020   HGB 13.0 08/27/2020   HCT 40.5 08/27/2020   MCV 87 08/27/2020   PLT 346 08/27/2020   Lab Results  Component Value Date   ALT 167 (H) 08/27/2020   AST 122 (H) 08/27/2020   ALKPHOS 80 08/27/2020   BILITOT 0.3 08/27/2020     Review of Systems  Constitutional: Positive for fatigue. Negative for appetite change, chills, fever and unexpected weight change.  Respiratory: Negative for cough, chest tightness, shortness of breath and wheezing.   Cardiovascular: Positive for palpitations. Negative for chest pain and leg swelling.  Gastrointestinal: Positive for abdominal pain. Negative for diarrhea, nausea and vomiting.  Genitourinary: Positive for frequency and  urgency. Negative for dysuria and hematuria.  Neurological: Negative for dizziness, light-headedness and headaches.  Psychiatric/Behavioral: Positive for depression, dysphoric mood and sleep disturbance. Negative for suicidal ideas. The patient is nervous/anxious and has insomnia.     Patient Active Problem List   Diagnosis Date Noted  . Chronic idiopathic urticaria 11/06/2020  . Current moderate episode of major depressive disorder without prior episode (HCC) 11/06/2020    No Known Allergies  History reviewed. No pertinent surgical history.  Social History   Tobacco Use  . Smoking status: Former  Smoker    Packs/day: 1.00    Years: 7.00    Pack years: 7.00    Types: Cigarettes    Quit date: 2012    Years since quitting: 10.2  . Smokeless tobacco: Never Used  Vaping Use  . Vaping Use: Never used  Substance Use Topics  . Alcohol use: Yes    Alcohol/week: 3.0 standard drinks    Types: 3 Glasses of wine per week  . Drug use: Never    Medication list has been reviewed and updated.  Current Meds  Medication Sig  . Cetirizine-Pseudoephedrine (ZYRTEC-D PO) Take by mouth.  . escitalopram (LEXAPRO) 10 MG tablet Take 1 tablet (10 mg total) by mouth daily.  . famotidine (PEPCID) 20 MG tablet Take 20 mg by mouth 2 (two) times daily.  . hydrOXYzine (ATARAX/VISTARIL) 50 MG tablet Take 25-50 mg by mouth 3 (three) times daily as needed.  . Ibuprofen 200 MG CAPS Take 400 mg by mouth every 6 (six) hours as needed.  Marland Kitchen LEVOCETIRIZINE DIHYDROCHLORIDE PO Take 10 mg by mouth every evening.  . nitrofurantoin, macrocrystal-monohydrate, (MACROBID) 100 MG capsule Take 1 capsule (100 mg total) by mouth 2 (two) times daily for 7 days.  . Omalizumab (XOLAIR [place]) Inject 150 mg into the skin every 28 (twenty-eight) days. Dr. Cheree Ditto.    [facility] 2/9 Scores 11/06/2020 11/06/2020  PHQ - 2 Score 4 0  PHQ- 9 Score 12 0    GAD 7 : Generalized Anxiety Score 11/06/2020 11/06/2020  Nervous, Anxious, on Edge 2 0  Control/stop worrying 2 0  Worry too much - different things 2 0  Trouble relaxing 2 0  Restless 3 0  Easily annoyed or irritable 3 0  Afraid - awful might happen 3 0  Total GAD 7 Score 17 0  Anxiety Difficulty Not difficult at all Not difficult at all    BP Readings from Last 3 Encounters:  11/06/20 118/70  08/26/20 129/89    Physical Exam Vitals and nursing note reviewed.  Constitutional:      General: She is not in acute distress.    Appearance: Normal appearance. She is well-developed.  HENT:     Head: Normocephalic and atraumatic.  Neck:     Vascular: No carotid bruit.  Cardiovascular:      Rate and Rhythm: Normal rate and regular rhythm.     Pulses: Normal pulses.     Heart sounds: No murmur heard.   Pulmonary:     Effort: Pulmonary effort is normal. No respiratory distress.     Breath sounds: No wheezing or rhonchi.  Abdominal:     General: Abdomen is flat.     Palpations: Abdomen is soft.     Tenderness: There is abdominal tenderness in the right lower quadrant. There is right CVA tenderness. There is no left CVA tenderness, guarding or rebound.  Musculoskeletal:     Cervical back: Normal range of motion.  Lymphadenopathy:  Cervical: No cervical adenopathy.  Skin:    General: Skin is warm and dry.     Findings: No rash.  Neurological:     Mental Status: She is alert and oriented to person, place, and time.  Psychiatric:        Attention and Perception: Attention normal.        Mood and Affect: Mood is depressed. Affect is not tearful.        Behavior: Behavior normal.        Thought Content: Thought content does not include suicidal plan.     Wt Readings from Last 3 Encounters:  11/06/20 188 lb (85.3 kg)  08/26/20 185 lb (83.9 kg)    BP 118/70   Pulse 80   Ht 5\' 4"  (1.626 m)   Wt 188 lb (85.3 kg)   LMP 11/05/2020 (Exact Date)   SpO2 97%   BMI 32.27 kg/m   Assessment and Plan: 1. Elevated liver function tests Possible residual from earlier Covid infection Will repeat today - if still elevated, will need Abd 01/05/2021 complete - Comprehensive metabolic panel - Hepatitis, Acute  2. Elevated serum glucose Check A1C and advise - Hemoglobin A1c  3. Bacteriuria Possible UTI contributing to low abdominal discomfort - nitrofurantoin, macrocrystal-monohydrate, (MACROBID) 100 MG capsule; Take 1 capsule (100 mg total) by mouth 2 (two) times daily for 7 days.  Dispense: 14 capsule; Refill: 0  4. Current moderate episode of major depressive disorder without prior episode (HCC) Due to family situation No SI/HI.  Will begin Lexapro. Follow up in 6 weeks,  sooner if needed. - escitalopram (LEXAPRO) 10 MG tablet; Take 1 tablet (10 mg total) by mouth daily.  Dispense: 30 tablet; Refill: 1  5. Chronic idiopathic urticaria Followed by Dr. [place] On Xolair, zyrtec and Vistaril   Partially dictated using Dragon software. Any errors are unintentional.  Cheree Ditto, MD [facility]  11/06/2020

## 2020-11-07 ENCOUNTER — Encounter: Payer: Self-pay | Admitting: Internal Medicine

## 2020-11-07 LAB — COMPREHENSIVE METABOLIC PANEL
ALT: 90 IU/L — ABNORMAL HIGH (ref 0–32)
AST: 71 IU/L — ABNORMAL HIGH (ref 0–40)
Albumin/Globulin Ratio: 1.8 (ref 1.2–2.2)
Albumin: 4.6 g/dL (ref 3.8–4.8)
Alkaline Phosphatase: 65 IU/L (ref 44–121)
BUN/Creatinine Ratio: 10 (ref 9–23)
BUN: 7 mg/dL (ref 6–20)
Bilirubin Total: 0.2 mg/dL (ref 0.0–1.2)
CO2: 23 mmol/L (ref 20–29)
Calcium: 9.5 mg/dL (ref 8.7–10.2)
Chloride: 103 mmol/L (ref 96–106)
Creatinine, Ser: 0.71 mg/dL (ref 0.57–1.00)
Globulin, Total: 2.5 g/dL (ref 1.5–4.5)
Glucose: 85 mg/dL (ref 65–99)
Potassium: 4 mmol/L (ref 3.5–5.2)
Sodium: 143 mmol/L (ref 134–144)
Total Protein: 7.1 g/dL (ref 6.0–8.5)
eGFR: 111 mL/min/{1.73_m2} (ref >59)

## 2020-11-07 LAB — HEPATITIS PANEL, ACUTE
Hep A IgM: NEGATIVE
Hep B C IgM: NEGATIVE
Hep C Virus Ab: <0.1 s/co ratio (ref 0.0–0.9)
Hepatitis B Surface Ag: NEGATIVE

## 2020-11-07 LAB — HEMOGLOBIN A1C
Est. average glucose Bld gHb Est-mCnc: 120 mg/dL
Hgb A1c MFr Bld: 5.8 % — ABNORMAL HIGH (ref 4.8–5.6)

## 2020-12-06 ENCOUNTER — Other Ambulatory Visit: Payer: Self-pay | Admitting: Internal Medicine

## 2020-12-06 ENCOUNTER — Encounter: Payer: Self-pay | Admitting: *Deleted

## 2020-12-06 DIAGNOSIS — F321 Major depressive disorder, single episode, moderate: Secondary | ICD-10-CM

## 2020-12-06 NOTE — Telephone Encounter (Signed)
This encounter was created in error - please disregard.

## 2020-12-18 ENCOUNTER — Encounter: Payer: Self-pay | Admitting: Internal Medicine

## 2020-12-18 ENCOUNTER — Ambulatory Visit: Payer: Self-pay | Admitting: Internal Medicine

## 2020-12-19 ENCOUNTER — Telehealth: Payer: Self-pay | Admitting: Internal Medicine

## 2020-12-20 ENCOUNTER — Other Ambulatory Visit: Payer: Self-pay

## 2020-12-20 ENCOUNTER — Encounter: Payer: Self-pay | Admitting: Internal Medicine

## 2020-12-20 ENCOUNTER — Telehealth (INDEPENDENT_AMBULATORY_CARE_PROVIDER_SITE_OTHER): Payer: Self-pay | Admitting: Internal Medicine

## 2020-12-20 ENCOUNTER — Telehealth: Payer: Self-pay

## 2020-12-20 VITALS — Ht 64.0 in

## 2020-12-20 DIAGNOSIS — R7989 Other specified abnormal findings of blood chemistry: Secondary | ICD-10-CM

## 2020-12-20 DIAGNOSIS — F5104 Psychophysiologic insomnia: Secondary | ICD-10-CM

## 2020-12-20 DIAGNOSIS — F321 Major depressive disorder, single episode, moderate: Secondary | ICD-10-CM

## 2020-12-20 DIAGNOSIS — R7303 Prediabetes: Secondary | ICD-10-CM | POA: Insufficient documentation

## 2020-12-20 MED ORDER — ZALEPLON 10 MG PO CAPS
10.0000 mg | ORAL_CAPSULE | Freq: Every evening | ORAL | 1 refills | Status: DC | PRN
Start: 2020-12-20 — End: 2021-04-02

## 2020-12-20 MED ORDER — ESCITALOPRAM OXALATE 20 MG PO TABS
20.0000 mg | ORAL_TABLET | Freq: Every day | ORAL | 2 refills | Status: DC
Start: 2020-12-20 — End: 2021-04-02

## 2020-12-20 NOTE — Telephone Encounter (Signed)
This visit type is being conducted due to national recommendations for restrictions regarding the COVID- 19 Pandemic (e.g. social distancing) in effort to limit this patients exposure and mitigate transmission in our community. This visit type is felt to be most appropriate for this patient at this time. I connected with the patient today and received telephone consent from the patient and patient understand this consent will be good for 1 year.  KP  

## 2020-12-20 NOTE — Progress Notes (Signed)
Date:  12/20/2020   Name:  Andrea Mcgee   DOB:  13-Aug-1980   MRN:  [id number]  This encounter was conducted via video encounter due to the need for social distancing in light of the Covid-19 pandemic.  The patient was correctly identified.  I advised that I am conducting the visit from a secure room in my office at [facility] clinic.  The patient is located at work. The limitations of this form of encounter were discussed with the patient and he/she agreed to proceed.  Some vital signs will be absent.  Chief Complaint: Depression (Taking lexapro, pt stated its helping but not a lot, higher dosage??)  Depression        This is a new problem.  The current episode started more than 1 month ago.   The problem has been gradually improving since onset.  Associated symptoms include no fatigue, not irritable, no body aches, no headaches and no suicidal ideas.  Past treatments include SSRIs - Selective serotonin reuptake inhibitors (lexapro 10 mg started last visit).  Elevated liver tests - these were improved at last check.  For insurance reasons we did not jump directly to [place]. Patient feels well.   No abdominal pain or jaundice.  Lab Results  Component Value Date   CREATININE 0.71 11/06/2020   BUN 7 11/06/2020   NA 143 11/06/2020   K 4.0 11/06/2020   CL 103 11/06/2020   CO2 23 11/06/2020   No results found for: CHOL, HDL, LDLCALC, LDLDIRECT, TRIG, CHOLHDL No results found for: TSH Lab Results  Component Value Date   HGBA1C 5.8 (H) 11/06/2020   Lab Results  Component Value Date   WBC 6.8 08/27/2020   HGB 13.0 08/27/2020   HCT 40.5 08/27/2020   MCV 87 08/27/2020   PLT 346 08/27/2020   Lab Results  Component Value Date   ALT 90 (H) 11/06/2020   AST 71 (H) 11/06/2020   ALKPHOS 65 11/06/2020   BILITOT 0.2 11/06/2020     Review of Systems  Constitutional: Negative for chills, fatigue, fever and unexpected weight change.  Respiratory: Negative for chest tightness and  shortness of breath.   Cardiovascular: Negative for chest pain.  Gastrointestinal: Negative for abdominal pain, constipation and diarrhea.  Neurological: Negative for dizziness, light-headedness and headaches.  Psychiatric/Behavioral: Positive for depression, dysphoric mood and sleep disturbance. Negative for suicidal ideas. The patient is nervous/anxious.     Patient Active Problem List   Diagnosis Date Noted  . Prediabetes 12/20/2020  . Elevated liver function tests 12/20/2020  . Chronic idiopathic urticaria 11/06/2020  . Current moderate episode of major depressive disorder without prior episode (HCC) 11/06/2020    No Known Allergies  History reviewed. No pertinent surgical history.  Social History   Tobacco Use  . Smoking status: Former Smoker    Packs/day: 1.00    Years: 7.00    Pack years: 7.00    Types: Cigarettes    Quit date: 2012    Years since quitting: 10.4  . Smokeless tobacco: Never Used  Vaping Use  . Vaping Use: Never used  Substance Use Topics  . Alcohol use: Yes    Alcohol/week: 3.0 standard drinks    Types: 3 Glasses of wine per week  . Drug use: Never     Medication list has been reviewed and updated.  Current Meds  Medication Sig  . Cetirizine-Pseudoephedrine (ZYRTEC-D PO) Take by mouth.  . escitalopram (LEXAPRO) 10 MG tablet Take 1 tablet (  10 mg total) by mouth daily.  . famotidine (PEPCID) 20 MG tablet Take 20 mg by mouth 2 (two) times daily.  . hydrOXYzine (ATARAX/VISTARIL) 50 MG tablet Take 25-50 mg by mouth 3 (three) times daily as needed.  . Ibuprofen 200 MG CAPS Take 400 mg by mouth every 6 (six) hours as needed.  Marland Kitchen LEVOCETIRIZINE DIHYDROCHLORIDE PO Take 20 mg by mouth every evening. xyzal  . Omalizumab (XOLAIR [place]) Inject 150 mg into the skin every 28 (twenty-eight) days. Dr. Cheree Ditto.    [facility] 2/9 Scores 12/20/2020 11/06/2020 11/06/2020  PHQ - 2 Score 4 4 0  PHQ- 9 Score 13 12 0    GAD 7 : Generalized Anxiety Score 12/20/2020 11/06/2020  11/06/2020  Nervous, Anxious, on Edge 2 2 0  Control/stop worrying 2 2 0  Worry too much - different things 2 2 0  Trouble relaxing 1 2 0  Restless 1 3 0  Easily annoyed or irritable 3 3 0  Afraid - awful might happen 1 3 0  Total GAD 7 Score 12 17 0  Anxiety Difficulty - Not difficult at all Not difficult at all    BP Readings from Last 3 Encounters:  11/06/20 118/70  08/26/20 129/89    Physical Exam Constitutional:      General: She is not irritable.    Appearance: Normal appearance.  Pulmonary:     Effort: Pulmonary effort is normal.  Neurological:     Mental Status: She is alert.  Psychiatric:        Attention and Perception: Attention normal.        Mood and Affect: Mood normal.        Speech: Speech normal.        Behavior: Behavior normal.        Cognition and Memory: Cognition normal.     Wt Readings from Last 3 Encounters:  11/06/20 188 lb (85.3 kg)  08/26/20 185 lb (83.9 kg)    Ht 5\' 4"  (1.626 m)   BMI 32.27 kg/m   Assessment and Plan: 1. Current moderate episode of major depressive disorder without prior episode (HCC) Depressive sx are slightly improved.  She is not having any side effects at this time. Will increase the dose to 20 mg. Follow up in 6 weeks. - escitalopram (LEXAPRO) 20 MG tablet; Take 1 tablet (20 mg total) by mouth daily.  Dispense: 30 tablet; Refill: 2  2. Elevated liver function tests Hepatitis panel negative. Repeat labs; if still elevated will recommend .  Suspect fatty liver is contributing. - Hepatic function panel  3. Prediabetes Continue work on diet and weight loss.  4. Psychophysiological insomnia Persistent trouble falling asleep - not benefited by otc medications - zaleplon (SONATA) 10 MG capsule; Take 1 capsule (10 mg total) by mouth at bedtime as needed for sleep.  Dispense: 30 capsule; Refill: 1  I spent 10 minutes on the encounter including reviewing labs and consultation. Partially dictated using [place]. Any errors are unintentional.  Barista, MD [facility]  12/20/2020

## 2021-01-08 LAB — HEPATIC FUNCTION PANEL
ALT: 61 IU/L — ABNORMAL HIGH (ref 0–32)
AST: 45 IU/L — ABNORMAL HIGH (ref 0–40)
Albumin: 4.9 g/dL — ABNORMAL HIGH (ref 3.8–4.8)
Alkaline Phosphatase: 69 IU/L (ref 44–121)
Bilirubin Total: 0.2 mg/dL (ref 0.0–1.2)
Bilirubin, Direct: <0.1 mg/dL (ref 0.00–0.40)
Total Protein: 7.2 g/dL (ref 6.0–8.5)

## 2021-01-14 ENCOUNTER — Encounter: Payer: Self-pay | Admitting: Internal Medicine

## 2021-03-28 ENCOUNTER — Encounter: Payer: Self-pay | Admitting: Internal Medicine

## 2021-04-02 ENCOUNTER — Ambulatory Visit (INDEPENDENT_AMBULATORY_CARE_PROVIDER_SITE_OTHER): Payer: Self-pay | Admitting: Internal Medicine

## 2021-04-02 ENCOUNTER — Other Ambulatory Visit: Payer: Self-pay

## 2021-04-02 ENCOUNTER — Encounter: Payer: Self-pay | Admitting: Internal Medicine

## 2021-04-02 VITALS — BP 120/84 | HR 75 | Temp 98.3°F | Ht 64.0 in | Wt 170.0 lb

## 2021-04-02 DIAGNOSIS — F321 Major depressive disorder, single episode, moderate: Secondary | ICD-10-CM

## 2021-04-02 DIAGNOSIS — R7989 Other specified abnormal findings of blood chemistry: Secondary | ICD-10-CM

## 2021-04-02 DIAGNOSIS — L501 Idiopathic urticaria: Secondary | ICD-10-CM

## 2021-04-02 DIAGNOSIS — F5104 Psychophysiologic insomnia: Secondary | ICD-10-CM

## 2021-04-02 MED ORDER — ZALEPLON 10 MG PO CAPS: 10.0000 mg | ORAL_CAPSULE | Freq: Every evening | ORAL | 1 refills | Status: DC | PRN

## 2021-04-02 MED ORDER — ESCITALOPRAM OXALATE 20 MG PO TABS: 20.0000 mg | ORAL_TABLET | Freq: Every day | ORAL | 3 refills | Status: DC

## 2021-04-02 NOTE — Progress Notes (Signed)
Date:  04/02/2021   Name:  Andrea Mcgee   DOB:  Sep 17, 1980   MRN:  [id number]   Chief Complaint: Anxiety and Weight Loss (Wants to lose weight pt exercises 5 days a week, walking and jogging sometimes 8 miles a day, has cut out pork for 1.5 months so far and changed other eating habits  )  Anxiety Presents for follow-up (much improved on Lexapro) visit. Symptoms include insomnia and nervous/anxious behavior. Patient reports no compulsions, dizziness, excessive worry, palpitations, panic, shortness of breath or suicidal ideas. Symptoms occur occasionally. The severity of symptoms is mild. The quality of sleep is good.   Compliance with medications is 76-100%.  Abdominal Pain This is a recurrent problem. The pain is located in the RLQ. The pain is mild. The quality of the pain is cramping and a sensation of fullness. The abdominal pain does not radiate. Associated symptoms include constipation. Pertinent negatives include no fever or headaches.  Insomnia Primary symptoms: sleep disturbance, difficulty falling asleep, frequent awakening.   The problem occurs every several days. Past treatments include medication. The treatment provided significant relief.   Lab Results  Component Value Date   CREATININE 0.71 11/06/2020   BUN 7 11/06/2020   NA 143 11/06/2020   K 4.0 11/06/2020   CL 103 11/06/2020   CO2 23 11/06/2020   No results found for: CHOL, HDL, LDLCALC, LDLDIRECT, TRIG, CHOLHDL No results found for: TSH Lab Results  Component Value Date   HGBA1C 5.8 (H) 11/06/2020   Lab Results  Component Value Date   WBC 6.8 08/27/2020   HGB 13.0 08/27/2020   HCT 40.5 08/27/2020   MCV 87 08/27/2020   PLT 346 08/27/2020   Lab Results  Component Value Date   ALT 61 (H) 01/07/2021   AST 45 (H) 01/07/2021   ALKPHOS 69 01/07/2021   BILITOT 0.2 01/07/2021     Review of Systems  Constitutional:  Negative for chills, fatigue, fever and unexpected weight change (has lost 18 lbs with  diet and exercise).  Respiratory:  Negative for shortness of breath.   Cardiovascular:  Negative for palpitations.  Gastrointestinal:  Positive for abdominal pain and constipation.  Skin:        No hives - stopped meds and doing well  Neurological:  Negative for dizziness and headaches.  Psychiatric/Behavioral:  Positive for dysphoric mood and sleep disturbance. Negative for suicidal ideas. The patient is nervous/anxious and has insomnia.    Patient Active Problem List   Diagnosis Date Noted   Prediabetes 12/20/2020   Elevated liver function tests 12/20/2020   Chronic idiopathic urticaria 11/06/2020   Current moderate episode of major depressive disorder without prior episode (HCC) 11/06/2020    No Known Allergies  History reviewed. No pertinent surgical history.  Social History   Tobacco Use   Smoking status: Former    Packs/day: 1.00    Years: 7.00    Pack years: 7.00    Types: Cigarettes    Quit date: 2012    Years since quitting: 10.6   Smokeless tobacco: Never  Vaping Use   Vaping Use: Never used  Substance Use Topics   Alcohol use: Yes    Alcohol/week: 3.0 standard drinks    Types: 3 Glasses of wine per week   Drug use: Never     Medication list has been reviewed and updated.  Current Meds  Medication Sig   escitalopram (LEXAPRO) 20 MG tablet Take 1 tablet (20 mg total) by mouth  daily.   famotidine (PEPCID) 20 MG tablet Take 20 mg by mouth 2 (two) times daily.   Ibuprofen 200 MG CAPS Take 400 mg by mouth every 6 (six) hours as needed.   LEVOCETIRIZINE DIHYDROCHLORIDE PO Take 20 mg by mouth every evening. xyzal   zaleplon (SONATA) 10 MG capsule Take 1 capsule (10 mg total) by mouth at bedtime as needed for sleep.   [DISCONTINUED] Cetirizine-Pseudoephedrine (ZYRTEC-D PO) Take by mouth.    PHQ 2/9 Scores 04/02/2021 12/20/2020 11/06/2020 11/06/2020  PHQ - 2 Score 2 4 4  0  PHQ- 9 Score 14 13 12  0    GAD 7 : Generalized Anxiety Score 04/02/2021 12/20/2020 11/06/2020  11/06/2020  Nervous, Anxious, on Edge 1 2 2  0  Control/stop worrying 2 2 2  0  Worry too much - different things 2 2 2  0  Trouble relaxing 1 1 2  0  Restless 1 1 3  0  Easily annoyed or irritable 2 3 3  0  Afraid - awful might happen 1 1 3  0  Total GAD 7 Score 10 12 17  0  Anxiety Difficulty - - Not difficult at all Not difficult at all    BP Readings from Last 3 Encounters:  04/02/21 120/84  11/06/20 118/70  08/26/20 129/89    Physical Exam Vitals and nursing note reviewed.  Constitutional:      General: She is not in acute distress.    Appearance: Normal appearance. She is well-developed.  HENT:     Head: Normocephalic and atraumatic.  Cardiovascular:     Rate and Rhythm: Normal rate and regular rhythm.     Pulses: Normal pulses.     Heart sounds: No murmur heard. Pulmonary:     Effort: Pulmonary effort is normal. No respiratory distress.     Breath sounds: No wheezing or rhonchi.  Abdominal:     General: Abdomen is flat. Bowel sounds are normal.     Palpations: Abdomen is soft. There is no fluid wave, hepatomegaly or splenomegaly.     Tenderness: There is no abdominal tenderness.  Musculoskeletal:     Cervical back: Normal range of motion.  Lymphadenopathy:     Cervical: No cervical adenopathy.  Skin:    General: Skin is warm and dry.     Findings: No rash.  Neurological:     Mental Status: She is alert and oriented to person, place, and time.  Psychiatric:        Mood and Affect: Mood normal.        Behavior: Behavior normal.    Wt Readings from Last 3 Encounters:  04/02/21 170 lb (77.1 kg)  11/06/20 188 lb (85.3 kg)  08/26/20 185 lb (83.9 kg)    BP 120/84   Pulse 75   Temp 98.3 F (36.8 C) (Oral)   Ht 5\' 4"  (1.626 m)   Wt 170 lb (77.1 kg)   LMP 03/27/2021   SpO2 95%   BMI 29.18 kg/m   Assessment and Plan: 1. Current moderate episode of major depressive disorder without prior episode [facility]) She feels that she is much improved on current medication.   Her anxiety is less and she has been able to stop her medication for chronic urticaria. - escitalopram (LEXAPRO) 20 MG tablet; Take 1 tablet (20 mg total) by mouth daily.  Dispense: 30 tablet; Refill: 3  2. Elevated liver function tests Doing well with diet and weight loss; avoiding alcohol Will repeat labs If she gets insurance, will also do - Hepatic  function panel  3. Chronic idiopathic urticaria Now off of medication and doing well.  4. Psychophysiological insomnia Continue to use Sonata as needed. - zaleplon (SONATA) 10 MG capsule; Take 1 capsule (10 mg total) by mouth at bedtime as needed for sleep.  Dispense: 30 capsule; Refill: 1   Partially dictated using Animal nutritionist. Any errors are unintentional.  Bari Edward, MD [facility]  04/02/2021

## 2021-04-05 LAB — HEPATIC FUNCTION PANEL
ALT: 46 IU/L — ABNORMAL HIGH (ref 0–32)
AST: 29 IU/L (ref 0–40)
Albumin: 4.9 g/dL — ABNORMAL HIGH (ref 3.8–4.8)
Alkaline Phosphatase: 62 IU/L (ref 44–121)
Bilirubin Total: 0.3 mg/dL (ref 0.0–1.2)
Bilirubin, Direct: 0.11 mg/dL (ref 0.00–0.40)
Total Protein: 7.2 g/dL (ref 6.0–8.5)

## 2021-04-23 ENCOUNTER — Encounter: Payer: Self-pay | Admitting: Internal Medicine

## 2021-04-23 ENCOUNTER — Other Ambulatory Visit: Payer: Self-pay | Admitting: Internal Medicine

## 2021-04-23 DIAGNOSIS — F5104 Psychophysiologic insomnia: Secondary | ICD-10-CM

## 2021-04-23 DIAGNOSIS — F321 Major depressive disorder, single episode, moderate: Secondary | ICD-10-CM

## 2021-04-23 MED ORDER — ESCITALOPRAM OXALATE 20 MG PO TABS: 20.0000 mg | ORAL_TABLET | Freq: Every day | ORAL | 1 refills | Status: DC

## 2021-04-23 MED ORDER — ZALEPLON 10 MG PO CAPS: 10.0000 mg | ORAL_CAPSULE | Freq: Every evening | ORAL | 0 refills | Status: DC | PRN

## 2021-06-22 ENCOUNTER — Other Ambulatory Visit: Payer: Self-pay | Admitting: Internal Medicine

## 2021-06-22 DIAGNOSIS — F321 Major depressive disorder, single episode, moderate: Secondary | ICD-10-CM

## 2021-06-23 NOTE — Telephone Encounter (Signed)
Requested Prescriptions  Pending Prescriptions Disp Refills  . escitalopram (LEXAPRO) 20 MG tablet [Pharmacy Med Name: ESCITALOPRAM 20 MG TAB[*]] 90 tablet 0    Sig: TAKE ONE TABLET BY MOUTH ONE TIME DAILY     Psychiatry:  Antidepressants - SSRI Passed - 06/22/2021  3:47 AM      Passed - Completed PHQ-2 or PHQ-9 in the last 360 days      Passed - Valid encounter within last 6 months    Recent Outpatient Visits          2 months ago Current moderate episode of major depressive disorder without prior episode [facility])   [facility] Reubin Milan, MD   6 months ago Current moderate episode of major depressive disorder without prior episode [facility])   [facility] Reubin Milan, MD   7 months ago Elevated liver function tests   [facility] Reubin Milan, MD

## 2021-10-08 ENCOUNTER — Other Ambulatory Visit: Payer: Self-pay | Admitting: Internal Medicine

## 2021-10-08 DIAGNOSIS — F5104 Psychophysiologic insomnia: Secondary | ICD-10-CM

## 2021-10-08 DIAGNOSIS — F321 Major depressive disorder, single episode, moderate: Secondary | ICD-10-CM

## 2021-10-09 MED ORDER — ZALEPLON 10 MG PO CAPS: 10.0000 mg | ORAL_CAPSULE | Freq: Every evening | ORAL | 0 refills | Status: AC | PRN

## 2021-10-09 MED ORDER — ESCITALOPRAM OXALATE 20 MG PO TABS: 20.0000 mg | ORAL_TABLET | Freq: Every day | ORAL | 0 refills | Status: AC

## 2021-10-09 NOTE — Telephone Encounter (Signed)
Please review. Last office visit 04/02/2021. ? ?KP

## 2021-10-15 ENCOUNTER — Ambulatory Visit: Payer: Self-pay | Admitting: Internal Medicine

## 2021-10-15 ENCOUNTER — Other Ambulatory Visit: Payer: Self-pay

## 2021-10-21 ENCOUNTER — Ambulatory Visit: Payer: Self-pay | Admitting: Internal Medicine
